# Patient Record
Sex: Female | Born: 1975
Health system: Southern US, Community
[De-identification: ages and names within clinical notes are randomized; demographics above are authoritative.]

## PROBLEM LIST (undated history)

## (undated) DIAGNOSIS — I10 Essential (primary) hypertension: Secondary | ICD-10-CM

## (undated) DIAGNOSIS — R519 Headache, unspecified: Secondary | ICD-10-CM

## (undated) DIAGNOSIS — T4145XA Adverse effect of unspecified anesthetic, initial encounter: Secondary | ICD-10-CM

## (undated) DIAGNOSIS — IMO0002 Reserved for concepts with insufficient information to code with codable children: Secondary | ICD-10-CM

## (undated) DIAGNOSIS — R51 Headache: Secondary | ICD-10-CM

## (undated) DIAGNOSIS — N926 Irregular menstruation, unspecified: Secondary | ICD-10-CM

## (undated) DIAGNOSIS — R87619 Unspecified abnormal cytological findings in specimens from cervix uteri: Secondary | ICD-10-CM

## (undated) DIAGNOSIS — D649 Anemia, unspecified: Secondary | ICD-10-CM

## (undated) DIAGNOSIS — T8859XA Other complications of anesthesia, initial encounter: Secondary | ICD-10-CM

## (undated) HISTORY — DX: Anemia, unspecified: D64.9

## (undated) HISTORY — DX: Irregular menstruation, unspecified: N92.6

## (undated) HISTORY — DX: Unspecified abnormal cytological findings in specimens from cervix uteri: R87.619

## (undated) HISTORY — DX: Adverse effect of unspecified anesthetic, initial encounter: T41.45XA

## (undated) HISTORY — DX: Other complications of anesthesia, initial encounter: T88.59XA

## (undated) HISTORY — PX: TUBAL LIGATION: SHX77

## (undated) HISTORY — DX: Reserved for concepts with insufficient information to code with codable children: IMO0002

## (undated) HISTORY — DX: Headache, unspecified: R51.9

## (undated) HISTORY — PX: OTHER SURGICAL HISTORY: SHX169

## (undated) HISTORY — DX: Headache: R51

---

## 2001-05-03 ENCOUNTER — Other Ambulatory Visit: Admission: RE | Admit: 2001-05-03 | Discharge: 2001-05-03 | Payer: Self-pay | Admitting: Obstetrics and Gynecology

## 2001-07-23 ENCOUNTER — Inpatient Hospital Stay (HOSPITAL_COMMUNITY): Admission: AD | Admit: 2001-07-23 | Discharge: 2001-07-23 | Payer: Self-pay | Admitting: Obstetrics and Gynecology

## 2001-08-09 ENCOUNTER — Other Ambulatory Visit: Admission: RE | Admit: 2001-08-09 | Discharge: 2001-08-09 | Payer: Self-pay | Admitting: Obstetrics and Gynecology

## 2001-09-29 ENCOUNTER — Inpatient Hospital Stay (HOSPITAL_COMMUNITY): Admission: AD | Admit: 2001-09-29 | Discharge: 2001-10-02 | Payer: Self-pay | Admitting: Obstetrics and Gynecology

## 2001-09-29 ENCOUNTER — Encounter (INDEPENDENT_AMBULATORY_CARE_PROVIDER_SITE_OTHER): Payer: Self-pay | Admitting: Specialist

## 2002-01-07 ENCOUNTER — Other Ambulatory Visit: Admission: RE | Admit: 2002-01-07 | Discharge: 2002-01-07 | Payer: Self-pay | Admitting: Obstetrics and Gynecology

## 2002-03-12 ENCOUNTER — Other Ambulatory Visit: Admission: RE | Admit: 2002-03-12 | Discharge: 2002-03-12 | Payer: Self-pay | Admitting: Obstetrics and Gynecology

## 2002-03-27 ENCOUNTER — Emergency Department (HOSPITAL_COMMUNITY): Admission: EM | Admit: 2002-03-27 | Discharge: 2002-03-27 | Payer: Self-pay | Admitting: Emergency Medicine

## 2003-02-25 ENCOUNTER — Other Ambulatory Visit: Admission: RE | Admit: 2003-02-25 | Discharge: 2003-02-25 | Payer: Self-pay | Admitting: Obstetrics and Gynecology

## 2003-12-31 ENCOUNTER — Encounter: Admission: RE | Admit: 2003-12-31 | Discharge: 2003-12-31 | Payer: Self-pay | Admitting: Obstetrics and Gynecology

## 2004-09-15 ENCOUNTER — Emergency Department (HOSPITAL_COMMUNITY): Admission: EM | Admit: 2004-09-15 | Discharge: 2004-09-15 | Payer: Self-pay | Admitting: Emergency Medicine

## 2010-04-19 ENCOUNTER — Emergency Department (HOSPITAL_COMMUNITY)
Admission: EM | Admit: 2010-04-19 | Discharge: 2010-04-19 | Payer: Self-pay | Source: Home / Self Care | Admitting: Emergency Medicine

## 2010-12-17 LAB — CBC
HCT: 35.2 % — ABNORMAL LOW (ref 36.0–46.0)
Hemoglobin: 11.8 g/dL — ABNORMAL LOW (ref 12.0–15.0)
MCH: 29.1 pg (ref 26.0–34.0)
MCHC: 33.6 g/dL (ref 30.0–36.0)
MCV: 86.6 fL (ref 78.0–100.0)
Platelets: 224 10*3/uL (ref 150–400)
RBC: 4.06 MIL/uL (ref 3.87–5.11)
RDW: 14.5 % (ref 11.5–15.5)
WBC: 7.3 10*3/uL (ref 4.0–10.5)

## 2010-12-17 LAB — URINE MICROSCOPIC-ADD ON

## 2010-12-17 LAB — DIFFERENTIAL
Basophils Absolute: 0 10*3/uL (ref 0.0–0.1)
Basophils Relative: 0 % (ref 0–1)
Eosinophils Absolute: 0.1 10*3/uL (ref 0.0–0.7)
Eosinophils Relative: 1 % (ref 0–5)
Lymphocytes Relative: 18 % (ref 12–46)
Lymphs Abs: 1.3 10*3/uL (ref 0.7–4.0)
Monocytes Absolute: 0.4 10*3/uL (ref 0.1–1.0)
Monocytes Relative: 6 % (ref 3–12)
Neutro Abs: 5.4 10*3/uL (ref 1.7–7.7)
Neutrophils Relative %: 74 % (ref 43–77)

## 2010-12-17 LAB — URINALYSIS, ROUTINE W REFLEX MICROSCOPIC
Bilirubin Urine: NEGATIVE
Glucose, UA: NEGATIVE mg/dL
Ketones, ur: NEGATIVE mg/dL
Leukocytes, UA: NEGATIVE
Nitrite: NEGATIVE
Protein, ur: NEGATIVE mg/dL
Specific Gravity, Urine: 1.034 — ABNORMAL HIGH (ref 1.005–1.030)
Urobilinogen, UA: 1 mg/dL (ref 0.0–1.0)
pH: 5.5 (ref 5.0–8.0)

## 2010-12-17 LAB — COMPREHENSIVE METABOLIC PANEL
ALT: 44 U/L — ABNORMAL HIGH (ref 0–35)
AST: 52 U/L — ABNORMAL HIGH (ref 0–37)
Albumin: 3.8 g/dL (ref 3.5–5.2)
Alkaline Phosphatase: 60 U/L (ref 39–117)
BUN: 10 mg/dL (ref 6–23)
CO2: 26 mEq/L (ref 19–32)
Calcium: 9.1 mg/dL (ref 8.4–10.5)
Chloride: 104 mEq/L (ref 96–112)
Creatinine, Ser: 0.81 mg/dL (ref 0.4–1.2)
GFR calc Af Amer: >60 mL/min (ref >60)
GFR calc non Af Amer: >60 mL/min (ref >60)
Glucose, Bld: 159 mg/dL — ABNORMAL HIGH (ref 70–99)
Potassium: 3.5 mEq/L (ref 3.5–5.1)
Sodium: 137 mEq/L (ref 135–145)
Total Bilirubin: 0.4 mg/dL (ref 0.3–1.2)
Total Protein: 7.6 g/dL (ref 6.0–8.3)

## 2010-12-17 LAB — POCT CARDIAC MARKERS
CKMB, poc: <1 ng/mL — ABNORMAL LOW (ref 1.0–8.0)
Myoglobin, poc: 61.5 ng/mL (ref 12–200)
Troponin i, poc: <0.05 ng/mL (ref 0.00–0.09)

## 2010-12-17 LAB — D-DIMER, QUANTITATIVE: D-Dimer, Quant: 0.26 ug/mL-FEU (ref 0.00–0.48)

## 2010-12-17 LAB — URINE CULTURE: Colony Count: 50000

## 2010-12-17 LAB — POCT PREGNANCY, URINE: Preg Test, Ur: NEGATIVE

## 2010-12-17 LAB — LIPASE, BLOOD: Lipase: 30 U/L (ref 11–59)

## 2011-02-17 NOTE — Discharge Summary (Signed)
[facility]  Patient:    Diana Cherry, Diana Cherry Visit Number: [id number] MRN: [id number]          Service Type: OBS Location: 910A 9148 01 Attending Physician:  Leonard Schwartz Dictated by:   Mack Guise, C.N.M. Admit Date:  09/29/2001 Discharge Date: 10/02/2001                             Discharge Summary  ADMISSION DIAGNOSES: 1. Intrauterine pregnancy at term. 2. Active labor. 3. Two prior cesarean sections. 4. Desires repeat cesarean section. 5. Desires sterilization.  PROCEDURE:  Repeat low transverse cesarean section and bilateral tubal ligation.  DISCHARGE DIAGNOSES: 1. Intrauterine pregnancy at term. 2. Active labor. 3. Two prior cesarean sections. 4. Desires repeat cesarean section. 5. Desires sterilization.  HISTORY OF PRESENT ILLNESS:  Ms. Diana Cherry is a 35 year old, gravida 3, para 2-0-0-2, who presented in early labor at 39-2/7 weeks and requests repeat cesarean delivery and sterilization.  This was accomplished by Dr. Marline Backbone on 09/29/01, with the birth of a 6 pound 14 ounce female infant, [place], with Apgar scores of 9 at one minute and 9 at five minutes.  The patient has done well in the immediate postoperative period.  Her hemoglobin on the first postoperative day was 9.7.  She has remained afebrile and stable, and on this her third postoperative day she is deemed to be in satisfactory condition for discharge.  DISCHARGE INSTRUCTIONS:  Per [facility] handout.  DISCHARGE MEDICATIONS: 1. Motrin 600 mg p.o. q.6h. p.r.n. pain. 2. Tylox one or two p.o. q.3-4h. pain. 3. Prenatal vitamins.  FOLLOWUP:  Occur at [facility] in six weeks. Dictated by:   Mack Guise, C.N.M. Attending Physician:  Leonard Schwartz DD:  10/02/01 TD:  10/02/01 Job: [id number]

## 2011-02-17 NOTE — H&P (Signed)
[facility]  Patient:    Diana Cherry, Diana Cherry Visit Number: [id number] MRN: [id number]          Service Type: OBS Location: 910B 9162 01 Attending Physician:  Leonard Schwartz Dictated by:   Wynelle Bourgeois, CNM Admit Date:  09/29/2001                           History and Physical  HISTORY OF PRESENT ILLNESS:   This is a 35 year old gravida 3, para 2-0-0-2, at 39-2/7 weeks who presents with contractions every five minutes for one hour.  She denies leaking or bleeding and reports positive fetal movement. Her pregnancy has been followed by the M.D. service and remarkable for: 1. Previous C-section x 2, desires trial of labor. 2. Positive group B strep. 3. Rh negative. 4. Late to care. 5. Abnormal Pap. 6. Desires elective tubal ligation.  OBSTETRICAL HISTORY:          Primary C-section in 1996, at [redacted] weeks gestation weighing 6 pounds, 8 ounces, of a female infant.  She had a primary C-section in 1999, of a female infant at [redacted] weeks gestation weighing 5 pounds 8 ounces. On her second pregnancy she got to 10 cm and pushed for two hours, but then the babys heartbeat was dropping, and she had a repeat C-section.  PAST MEDICAL HISTORY:         1. 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                               2. History of Rh negative, with RhoGAM given at                                 28 weeks.                               3. Childhood varicella.  FAMILY HISTORY:               Father and maternal aunt with hypertension. Mother with anemia.  Maternal first cousin with asthma.  Maternal aunt with diabetes.  Mother with lupus.  Maternal aunt with migraines.  PAST SURGICAL HISTORY:        C-section x 2.  GENETIC HISTORY:              Unremarkable.  SOCIAL HISTORY:               The patient is single but involved with Royston Sinner, who is involved and supportive.  She is  of the [employer].  She denies any alcohol, tobacco, or drug use.  PHYSICAL EXAMINATION:  VITAL SIGNS:                  Stable.  Afebrile.  HEENT:                        Within normal limits.  NECK:  Thyroid normal, not enlarged.  BREASTS:                      Soft and nontender.  No masses.  CHEST:                        Clear to auscultation bilaterally.  HEART:                        Regular rate and rhythm.  ABDOMEN:                      Gravid, at 40 cm.  EFM shows reactive fetal heart rate tracing without decelerations.  Uterine contractions initially were every 5-6 minutes and are now every 3-4 minutes.  PELVIC:                       Cervical exam was initially 2 cm, 80% effaced, -1 station, with a vertex presentation.  After 1-1/2 hours, progressed to 3 cm, 85% effaced, and -1 station.  EXTREMITIES:                  Within normal limits.  ASSESSMENT:                   1. Intrauterine pregnancy at 39-2/7 weeks.                               2. Previous cesarean section, desires trial of                                  labor.                               3. Early labor.                               4. Group B strep positive.  PLAN:                         1. Admit to birthing suite.  Dr. Stefano Gaul                                  notified.                               2. Routine M.D. orders.                               3. Penicillin prophylaxis.                               4. Further orders per Dr. Stefano Gaul. Dictated by:   Wynelle Bourgeois, CNM Attending Physician:  Leonard Schwartz DD:  09/29/01 TD:  09/29/01 Job: [id number]

## 2011-02-17 NOTE — Op Note (Signed)
[facility]  Patient:    Diana, Cherry Visit Number: [id number] MRN: [id number]          Service Type: OBS Location: 910A 9148 01 Attending Physician:  Leonard Schwartz Dictated by:   Janine Limbo, M.D. Proc. Date: 09/29/01 Admit Date:  09/29/2001                             Operative Report  PREOPERATIVE DIAGNOSES:       1. Term intrauterine pregnancy.                               2. Active labor.                               3. Two prior cesarean sections.                               4. Desires repeat cesarean section.                               5. Desires sterilization.  POSTOPERATIVE DIAGNOSES:      1. Term intrauterine pregnancy.                               2. Active labor.                               3. Two prior cesarean sections.                               4. Desires repeat cesarean section.                               5. Desires sterilization.                               6. Pelvic adhesions.  PROCEDURES:                   1. Repeat low transverse cesarean section.                               2. Bilateral tubal ligation (modified [place]).  SURGEON:                      Janine Limbo, M.D.  FIRST ASSISTANT:              Wynelle Bourgeois, C.N.M.  ANESTHETIC:                   Spinal.  INDICATIONS:                  The patient is a 35 year old female, gravida 3, para 2-0-0-2 who presents at term with active labor.  She has had two prior cesarean deliveries.  She desires repeat cesarean section.  We discussed the benefits as well  as the risks associated with a vaginal birth after cesarean section.  After the patient and her husband discussed these risks and benefits, the patient decided to proceed with repeat cesarean section and tubal ligation.   We reviewed the risks, which include (but are not limited to) anesthetic complications, bleeding, infections, possible damage to surrounding organs  and possible tubal failure (10-17 per 1000).  FINDINGS:                     A 6 lb 14 oz female infant ([place]) was delivered from a cephalic presentation.  The infant was in the occiput posterior position.  Apgars were 9 at one minute and 9 at five minutes.  There were two nuchal cords present.  The fallopian tubes and ovaries appeared normal.  The uterus was normal except that there were dense adhesions between the anterior uterine surface and the anterior peritoneum.  DESCRIPTION OF PROCEDURE:     The patient was taken to the operating room, where a spinal anesthetic was given.  The patients abdomen, perineum and vagina were prepped with multiple layers of Betadine.  A Foley catheter was placed in the bladder.  The patient was sterilely draped.  A low transverse incision was made in the abdomen through the previous incision and carried sharply through the subcutaneous tissue, the fascia and the anterior peritoneum.  There was scarring from the patients prior two cesarean deliveries  The anterior peritoneum was adhered to the lower uterine segment. The adhesions were lysed using a combination of sharp and blunt dissection. An incision was made in the lower uterine segment and extended transversely. The amniotic fluid was noted to be clear.  The fetal head was then delivered without difficulty.  The mouth and nose were suctioned.  Two nuchal cords were noted.  The infant was in the occiput posterior position.  The infant was fully delivered and handed to the awaiting pediatric team.  Routine cord blood studies were obtained.  The placenta was removed.  The uterine cavity was cleaned of amniotic fluid and clotted blood.  The uterine incision was closed using a running suture of 2-0 Vicryl followed by interrupted sutures of 2-0 Vicryl for hemostasis.  Hemostasis was noted to be adequate.  The left fallopian tube was identified and followed to its fimbriated end.  A knuckle of tube was made  on the left using two free ties of 0 plain cat gut.  The knuckle of tube thus made was sharply excised.  Hemostasis was adequate.  An identical procedure was carried out on the opposite site.  Again, hemostasis was adequate.  The pelvis was vigorously irrigated.  The anterior peritoneum and the abdominal musculature were reapproximated in the midline using 2-0 Vicryl.  The fascia and the subcutaneous tissue were irrigated.  The fascia was closed using a running suture of 0 Vicryl followed by three interrupted sutures of 0 Vicryl.  Hemostasis was felt to be adequate in the subcutaneous layer.  The skin was reapproximated using a subcuticular suture of 4-0 Monocryl.  Sponge, needle and instrument counts were correct on two occasions. Estimated blood loss was 700 cc.  The patient tolerated the procedure well. She was taken to the recovery room in stable condition.  The patient was noted to drain clear yellow urine at the end of her procedure.  The infant was taken to the full-term nursery in stable condition. Dictated by:   Janine Limbo, M.D. Attending Physician:  Leonard Schwartz DD:  09/29/01 TD:  09/29/01 Job: [id number]

## 2012-02-04 ENCOUNTER — Emergency Department (HOSPITAL_COMMUNITY)
Admission: EM | Admit: 2012-02-04 | Discharge: 2012-02-04 | Disposition: A | Discharge status: Home / Self Care | Payer: PRIVATE HEALTH INSURANCE | Attending: Emergency Medicine | Admitting: Emergency Medicine

## 2012-02-04 ENCOUNTER — Emergency Department (HOSPITAL_COMMUNITY): Payer: PRIVATE HEALTH INSURANCE

## 2012-02-04 ENCOUNTER — Encounter (HOSPITAL_COMMUNITY): Payer: Self-pay | Admitting: Emergency Medicine

## 2012-02-04 DIAGNOSIS — Y9239 Other specified sports and athletic area as the place of occurrence of the external cause: Secondary | ICD-10-CM | POA: Insufficient documentation

## 2012-02-04 DIAGNOSIS — Y92838 Other recreation area as the place of occurrence of the external cause: Secondary | ICD-10-CM | POA: Insufficient documentation

## 2012-02-04 DIAGNOSIS — M79609 Pain in unspecified limb: Secondary | ICD-10-CM | POA: Insufficient documentation

## 2012-02-04 DIAGNOSIS — S6980XA Other specified injuries of unspecified wrist, hand and finger(s), initial encounter: Secondary | ICD-10-CM | POA: Insufficient documentation

## 2012-02-04 DIAGNOSIS — Y9367 Activity, basketball: Secondary | ICD-10-CM | POA: Insufficient documentation

## 2012-02-04 DIAGNOSIS — S6990XA Unspecified injury of unspecified wrist, hand and finger(s), initial encounter: Secondary | ICD-10-CM | POA: Insufficient documentation

## 2012-02-04 DIAGNOSIS — M6789 Other specified disorders of synovium and tendon, multiple sites: Secondary | ICD-10-CM

## 2012-02-04 DIAGNOSIS — W219XXA Striking against or struck by unspecified sports equipment, initial encounter: Secondary | ICD-10-CM | POA: Insufficient documentation

## 2012-02-04 NOTE — ED Notes (Signed)
5th finger on r/hand is bent, x 3 weeks due to sports related accident. Tx with ice

## 2012-02-04 NOTE — ED Provider Notes (Signed)
History     CSN: [id number]  Arrival date & time 02/04/12  1442   First MD Initiated Contact with Patient 02/04/12 1501      Chief Complaint  Patient presents with  . Finger Injury    sport related injury 3 weeks ago - r/fifth finger    (Consider location/radiation/quality/duration/timing/severity/associated sxs/prior treatment) HPI The patient presents with the deformity to her R little finger from an injury three weeks ago. She was struck on the end of this finger with a basketball. The patient has not gotten any relief with home splinting. The patient has no numbness in the finger. History reviewed. No pertinent past medical history.  Past Surgical History  Procedure Date  . Cesarean section     Family History  Problem Relation Age of Onset  . Hypertension Mother     History  Substance Use Topics  . Smoking status: Never Smoker   . Smokeless tobacco: Not on file  . Alcohol Use: Yes    OB History    Grav Para Term Preterm Abortions TAB SAB Ect Mult Living                  Review of Systems All other systems negative except as documented in the HPI. All pertinent positives and negatives as reviewed in the HPI.  Allergies  Review of patient's allergies indicates no known allergies.  Home Medications  No current outpatient prescriptions on file.  BP 144/87  Pulse 68  Temp(Src) 98.3 F (36.8 C) (Oral)  Resp 18  SpO2 98%  LMP 01/16/2012  Physical Exam Physical Examination: General appearance - alert, well appearing, and in no distress Chest - clear to auscultation, no wheezes, rales or rhonchi, symmetric air entry Heart - normal rate, regular rhythm, normal S1, S2, no murmurs, rubs, clicks or gallops Musculoskeletal - The patient has a flexion deformity of the R little finger. The patient has no ability to extend the finger.  ED Course  Procedures (including critical care time)  Labs Reviewed - No data to display Dg Finger Little Right  02/04/2012   *RADIOLOGY REPORT*  Clinical Data: Pain after blunt injury, dislocation  RIGHT LITTLE FINGER 2+V  Comparison: None.  Findings: There is no evidence of fracture.  There is mild medial and posterior angulation of the fifth DIP joint.  IMPRESSION: Medial and posterior angulation of the fifth DIP joint.  Original Report Authenticated By: Brandon Melnick, M.D.    I spoke with Dr. Lajoyce Corners about the patient and he will follow up with her in his office. The patient is splinted here.    MDM          Carlyle Dolly, PA-C 02/04/12 [phone]

## 2012-02-06 NOTE — ED Provider Notes (Signed)
Medical screening examination/treatment/procedure(s) were performed by non-physician practitioner and as supervising physician I was immediately available for consultation/collaboration.  Toy Baker, MD 02/06/12 [phone]

## 2012-05-30 ENCOUNTER — Encounter: Payer: BC Managed Care – PPO | Admitting: Obstetrics and Gynecology

## 2012-07-03 ENCOUNTER — Encounter: Payer: BC Managed Care – PPO | Admitting: Obstetrics and Gynecology

## 2012-07-05 ENCOUNTER — Encounter: Payer: Self-pay | Admitting: Obstetrics and Gynecology

## 2012-07-05 ENCOUNTER — Ambulatory Visit (INDEPENDENT_AMBULATORY_CARE_PROVIDER_SITE_OTHER): Payer: BC Managed Care – PPO | Admitting: Obstetrics and Gynecology

## 2012-07-05 VITALS — BP 126/96 | Ht 64.0 in | Wt 206.0 lb

## 2012-07-05 DIAGNOSIS — N926 Irregular menstruation, unspecified: Secondary | ICD-10-CM

## 2012-07-05 DIAGNOSIS — G5600 Carpal tunnel syndrome, unspecified upper limb: Secondary | ICD-10-CM

## 2012-07-05 DIAGNOSIS — E669 Obesity, unspecified: Secondary | ICD-10-CM

## 2012-07-05 DIAGNOSIS — Z124 Encounter for screening for malignant neoplasm of cervix: Secondary | ICD-10-CM

## 2012-07-05 LAB — CBC
HCT: 35.1 % — ABNORMAL LOW (ref 36.0–46.0)
Hemoglobin: 11.6 g/dL — ABNORMAL LOW (ref 12.0–15.0)
MCH: 27.7 pg (ref 26.0–34.0)
MCHC: 33 g/dL (ref 30.0–36.0)
MCV: 83.8 fL (ref 78.0–100.0)
Platelets: 238 10*3/uL (ref 150–400)
RBC: 4.19 MIL/uL (ref 3.87–5.11)
RDW: 15.7 % — ABNORMAL HIGH (ref 11.5–15.5)
WBC: 6.4 10*3/uL (ref 4.0–10.5)

## 2012-07-05 LAB — PROLACTIN: Prolactin: 4.2 ng/mL

## 2012-07-05 LAB — TSH: TSH: 1.321 u[IU]/mL (ref 0.350–4.500)

## 2012-07-05 LAB — HEMOGLOBIN A1C
Hgb A1c MFr Bld: 5.9 % — ABNORMAL HIGH (ref <5.7)
Mean Plasma Glucose: 123 mg/dL — ABNORMAL HIGH (ref <117)

## 2012-07-05 NOTE — Patient Instructions (Signed)

## 2012-07-05 NOTE — Progress Notes (Signed)
Last Pap: 56yrs ago WNL: Yes per pt Regular Periods:no Contraception: condoms  Monthly Breast exam:no Tetanus<34yrs:no Nl.Bladder Function:yes Daily BMs:yes Healthy Diet:yes Calcium:no Mammogram:yes Date of Mammogram: 51yrs ago-wnl per pt lump of right breast Exercise:yes Have often Exercise: daily Seatbelt: yes Abuse at home: no Stressful work:yes Sigmoid-colonoscopy: n/a Bone Density: No PCP: none Change in PMH: bilateral carpal tunnel Change in [facility]: see family history documentation BMI=36 Pt with several concerns.  She followed her menses.  They are q 24-25 days.  She changes a pad q 2-3 hrs. Passe clots. She feels like they come to often.  No bleeding disorders.  No dizziness or chest pain. They have been heavy for five years.  She also c/o bilateral hand numbness from carpal tunnel.  It makes it difficult to perform tasks.  She also has some swelling at the heels of her feet.  Pt also c/o of rectal pain.  No bleeding.  She has a BM QD, brown in color BP 126/96  Ht 5\' 4"  (1.626 m)  Wt 206 lb (93.441 kg)  BMI 35.36 kg/m2  LMP 06/17/2012 Pt with complaints: yes see above Physical Examination: General appearance - alert, well appearing, and in no distress Mental status - normal mood, behavior, speech, dress, motor activity, and thought processes Neck - supple, no significant adenopathy,  thyroid exam: thyroid is normal in size without nodules or tenderness Chest - clear to auscultation, no wheezes, rales or rhonchi, symmetric air entry Heart - normal rate and regular rhythm Abdomen - soft, nontender, nondistended, no masses or organomegaly Breasts - breasts appear normal, no suspicious masses, no skin or nipple changes or axillary nodes Pelvic - normal external genitalia, vulva, vagina, cervix, uterus and adnexa Rectal - normal rectal, no masses Back exam - full range of motion, no tenderness, palpable spasm or pain on motion Neurological - alert, oriented, normal speech, no  focal findings or movement disorder noted Musculoskeletal - no joint tenderness, deformity or swelling Extremities - no edema, redness or tenderness in the calves or thighs Skin - normal coloration and turgor, no rashes, no suspicious skin lesions noted Routine exam Pap sent yes Mammogram due no BTL used for contraception RT for shg/embx Refer to sports med for carpal tunnel Rectal exam normal. Pt declined I referral

## 2012-07-08 LAB — PAP IG W/ RFLX HPV ASCU

## 2012-07-09 ENCOUNTER — Telehealth: Payer: Self-pay

## 2012-07-09 NOTE — Telephone Encounter (Signed)
Tc to pt rgd referral for carpel tunnel pt has appt with Dr.Mckinnley 07/11/12 @ 9:00 lm on vm tcb rgd referral

## 2012-07-09 NOTE — Telephone Encounter (Signed)
Pt need to f/u with pcp rgd HGB A1C

## 2012-07-10 NOTE — Telephone Encounter (Signed)
Lm on vm tcb rgd labs and referral 

## 2012-07-11 NOTE — Telephone Encounter (Signed)
Lm on vm tcb rgd labs and referral

## 2012-07-11 NOTE — Telephone Encounter (Signed)
Spoke with pt rgd labs and referral informed hgb a1c elevated f/u with pcp informed pt of referral to Dr.Mickennly number given to r/s at her convince pt voice understanding

## 2012-07-22 ENCOUNTER — Ambulatory Visit (INDEPENDENT_AMBULATORY_CARE_PROVIDER_SITE_OTHER): Payer: BC Managed Care – PPO | Admitting: Obstetrics and Gynecology

## 2012-07-22 ENCOUNTER — Ambulatory Visit (INDEPENDENT_AMBULATORY_CARE_PROVIDER_SITE_OTHER): Payer: BC Managed Care – PPO

## 2012-07-22 ENCOUNTER — Encounter: Payer: Self-pay | Admitting: Obstetrics and Gynecology

## 2012-07-22 ENCOUNTER — Other Ambulatory Visit: Payer: Self-pay | Admitting: Obstetrics and Gynecology

## 2012-07-22 VITALS — BP 122/74 | HR 76 | Wt 209.0 lb

## 2012-07-22 DIAGNOSIS — N9489 Other specified conditions associated with female genital organs and menstrual cycle: Secondary | ICD-10-CM

## 2012-07-22 DIAGNOSIS — N926 Irregular menstruation, unspecified: Secondary | ICD-10-CM

## 2012-07-22 DIAGNOSIS — N920 Excessive and frequent menstruation with regular cycle: Secondary | ICD-10-CM

## 2012-07-22 NOTE — Progress Notes (Signed)
36 YO with history of menorrhagia presents for [facility] per Dr. Normand Sloop.   O: UterusL 9.60 x 6.11 x 5.15cm, endomterium 0.755 cm, a solid focus seen in the lower uterine segment left wall consistent with a polyp = 0.93 x 0.61 x 0.48       cm,  ovaries appear normal  A; Menorrhagia     Endometrial mass consistent with a polyp  P: Reviewed endometrial polyp and management      Reviewed management options for menorrhagia: hormonal, Lysteda, D & C and observation     To advise Dr. Normand Sloop that patient wants to pursue D & C (in office if possible)     RTO-TBA  Siah Kannan, PA-C

## 2012-07-22 NOTE — Patient Instructions (Signed)
endometrial polyps

## 2012-07-24 ENCOUNTER — Other Ambulatory Visit: Payer: Self-pay | Admitting: Obstetrics and Gynecology

## 2012-07-24 DIAGNOSIS — N926 Irregular menstruation, unspecified: Secondary | ICD-10-CM

## 2012-08-06 ENCOUNTER — Telehealth: Payer: Self-pay | Admitting: Obstetrics and Gynecology

## 2012-08-06 NOTE — Telephone Encounter (Signed)
D&C Hysteroscopy; Polypectomy with TruClear scheduled for 08/27/12 @ 9:00 with ND.  [employer] effective 05/02/12.  Plan pays 80/20 after a $300 deductible. Pre-op due $78.66. -Adrianne Pridgen

## 2012-08-13 ENCOUNTER — Other Ambulatory Visit: Payer: Self-pay | Admitting: Obstetrics and Gynecology

## 2012-08-15 ENCOUNTER — Encounter (HOSPITAL_COMMUNITY): Payer: Self-pay | Admitting: *Deleted

## 2012-08-16 ENCOUNTER — Encounter: Payer: Self-pay | Admitting: Obstetrics and Gynecology

## 2012-08-16 ENCOUNTER — Ambulatory Visit (INDEPENDENT_AMBULATORY_CARE_PROVIDER_SITE_OTHER): Payer: BC Managed Care – PPO | Admitting: Obstetrics and Gynecology

## 2012-08-16 VITALS — BP 140/88 | HR 84 | Temp 98.2°F | Resp 14 | Ht 64.0 in | Wt 211.0 lb

## 2012-08-16 DIAGNOSIS — N840 Polyp of corpus uteri: Secondary | ICD-10-CM

## 2012-08-16 DIAGNOSIS — Z01818 Encounter for other preprocedural examination: Secondary | ICD-10-CM

## 2012-08-16 DIAGNOSIS — N920 Excessive and frequent menstruation with regular cycle: Secondary | ICD-10-CM

## 2012-08-19 ENCOUNTER — Encounter (HOSPITAL_COMMUNITY): Payer: Self-pay | Admitting: Pharmacist

## 2012-08-20 NOTE — H&P (Signed)
  Diana Cherry is a 36 y.o. female [phone] who presents for hysteroscopy, dilatation, curettage and polypectomy because of menorrhagia and endometrial polyp. Over the past year, patient reports longer and heavier periods with them now lasting 7 days and requiring a super pad change every 30 minutes.  She denies cramping, urinary tract symptoms, or changes in bowel movements,  but states she feels very sore during her period and has started to bleed every two weeks for seven days.  A  07/22/12,  sonohysterogram showed  uterus 9.60 x 6.11 x 5.15 cm, endometrium 0.755 cm with a solid focus in the lower uterine segment consistent with a polyp-0.93 x 0.61 x 0.48 cm; both ovaries appeared normal. A TSH and Prolactin were normal as was a CBC except H/H=11.6/35.1.  After review of sonogram findings and consideration of medical and surgical interventions available, the patient opted to proceed with surgical management.   Past Medical History  OB History: [id number] C-section x 3;  1996, 1999 and 2002  GYN History: menarche: 10    LMP: 08/09/12; Contracepton: Tubal Sterilization  The patient denies history of sexually transmitted disease.  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 Gabapentin (NEURONTIN PO) Take by mouth.      . OxyCODONE HCl (OXYCONTIN PO) Take by mouth.        No Known Allergies Denies sensitivity to  peanuts, shellfish, soy, latex or adhesives.   ROS: Denies headache, vision changes, nasal congestion, dysphagia, tinnitus, dizziness, hoarseness, cough,  chest pain, shortness of breath, nausea, vomiting, diarrhea,constipation,  urinary frequency, urgency  dysuria, hematuria, vaginitis symptoms, pelvic pain, swelling of joints,easy bruising,  myalgias, arthralgias, skin rashes, unexplained weight loss and except as is mentioned in the history of present illness, patient's review of systems is otherwise negative.  Physical Exam    BP 140/88  Pulse 84  Temp 98.2 F (36.8 C) (Oral)  Resp 14  Ht 5\' 4"  (1.626 m)  Wt 211 lb (95.709 kg)  BMI 36.22 kg/m2  LMP 08/09/2012  Neck: supple without masses or thyromegaly Lungs: clear to auscultation Heart: regular rate and rhythm Abdomen: soft, non-tender and no organomegaly Pelvic:EGBUS- wnl; vagina-normal rugae; uterus-normal size, cervix without lesions or motion tenderness; adnexae-no tenderness or masses Extremities:  no clubbing, cyanosis or edema   Assesment:  Menorrhagia                        Endometrial Mass   Disposition:  A discussion was held with patient regarding the indication for her procedure(s) along with the risks, which include but are not limited to: reaction to anesthesia, damage to adjacent organs, infection, excessive bleeding and endometrial scarring.  Patient has consented to proceed with a hysteroscopy, dilatation, curettage and polypectomy using Tru-Clear at [facility] [place] on August 27, 2012 at 9 a.m.   CSN# [id number]   [name] J. Lowell Guitar, PA-C  for Dr. Pierre Bali. Dillard

## 2012-08-27 ENCOUNTER — Encounter (HOSPITAL_COMMUNITY)
Admission: RE | Disposition: A | Discharge status: Home / Self Care | Payer: Self-pay | Source: Ambulatory Visit | Attending: Obstetrics and Gynecology

## 2012-08-27 ENCOUNTER — Ambulatory Visit (HOSPITAL_COMMUNITY)
Admission: RE | Admit: 2012-08-27 | Discharge: 2012-08-27 | Disposition: A | Discharge status: Home / Self Care | Payer: Medicaid Other | Source: Ambulatory Visit | Attending: Obstetrics and Gynecology | Admitting: Obstetrics and Gynecology

## 2012-08-27 ENCOUNTER — Encounter (HOSPITAL_COMMUNITY): Payer: Self-pay | Admitting: Anesthesiology

## 2012-08-27 ENCOUNTER — Ambulatory Visit (HOSPITAL_COMMUNITY): Payer: Medicaid Other | Admitting: Anesthesiology

## 2012-08-27 DIAGNOSIS — N840 Polyp of corpus uteri: Secondary | ICD-10-CM | POA: Insufficient documentation

## 2012-08-27 DIAGNOSIS — N920 Excessive and frequent menstruation with regular cycle: Secondary | ICD-10-CM | POA: Insufficient documentation

## 2012-08-27 HISTORY — PX: DILATATION & CURRETTAGE/HYSTEROSCOPY WITH RESECTOCOPE: SHX5572

## 2012-08-27 LAB — CBC
HCT: 35.8 % — ABNORMAL LOW (ref 36.0–46.0)
Hemoglobin: 11.9 g/dL — ABNORMAL LOW (ref 12.0–15.0)
MCH: 28.3 pg (ref 26.0–34.0)
MCHC: 33.2 g/dL (ref 30.0–36.0)
MCV: 85 fL (ref 78.0–100.0)
Platelets: 203 10*3/uL (ref 150–400)
RBC: 4.21 MIL/uL (ref 3.87–5.11)
RDW: 14.6 % (ref 11.5–15.5)
WBC: 4.7 10*3/uL (ref 4.0–10.5)

## 2012-08-27 LAB — PREGNANCY, URINE: Preg Test, Ur: NEGATIVE

## 2012-08-27 SURGERY — DILATATION & CURETTAGE/HYSTEROSCOPY WITH RESECTOCOPE
Anesthesia: General | Site: Uterus | Wound class: Clean Contaminated

## 2012-08-27 MED ORDER — IBUPROFEN 600 MG PO TABS: 600.0000 mg | ORAL_TABLET | Freq: Four times a day (QID) | ORAL | Status: DC | PRN

## 2012-08-27 MED ORDER — FENTANYL CITRATE 0.05 MG/ML IJ SOLN
INTRAMUSCULAR | Status: AC
  Filled 2012-08-27: qty 2

## 2012-08-27 MED ORDER — LIDOCAINE HCL (CARDIAC) 20 MG/ML IV SOLN
INTRAVENOUS | Status: AC
  Filled 2012-08-27: qty 5

## 2012-08-27 MED ORDER — MIDAZOLAM HCL 2 MG/2ML IJ SOLN
INTRAMUSCULAR | Status: AC
  Filled 2012-08-27: qty 2

## 2012-08-27 MED ORDER — ONDANSETRON HCL 4 MG/2ML IJ SOLN
INTRAMUSCULAR | Status: AC
  Filled 2012-08-27: qty 2

## 2012-08-27 MED ORDER — PROPOFOL 10 MG/ML IV EMUL: INTRAVENOUS | Status: DC | PRN

## 2012-08-27 MED ORDER — LIDOCAINE HCL 2 % IJ SOLN
INTRAMUSCULAR | Status: AC
  Filled 2012-08-27: qty 20

## 2012-08-27 MED ORDER — LIDOCAINE HCL (CARDIAC) 20 MG/ML IV SOLN
INTRAVENOUS | Status: DC | PRN
  Administered 2012-08-27 (×2): 20 mg via INTRAVENOUS

## 2012-08-27 MED ORDER — KETOROLAC TROMETHAMINE 30 MG/ML IJ SOLN
INTRAMUSCULAR | Status: DC | PRN
  Administered 2012-08-27: 30 mg via INTRAVENOUS

## 2012-08-27 MED ORDER — ONDANSETRON HCL 4 MG/2ML IJ SOLN
INTRAMUSCULAR | Status: DC | PRN
  Administered 2012-08-27: 4 mg via INTRAVENOUS

## 2012-08-27 MED ORDER — FENTANYL CITRATE 0.05 MG/ML IJ SOLN
25.0000 ug | INTRAMUSCULAR | Status: DC | PRN
  Administered 2012-08-27 (×2): 50 ug via INTRAVENOUS

## 2012-08-27 MED ORDER — PROPOFOL 10 MG/ML IV EMUL
INTRAVENOUS | Status: DC | PRN
  Administered 2012-08-27: 20 mg via INTRAVENOUS
  Administered 2012-08-27: 180 mg via INTRAVENOUS

## 2012-08-27 MED ORDER — FENTANYL CITRATE 0.05 MG/ML IJ SOLN
INTRAMUSCULAR | Status: AC
  Administered 2012-08-27: 50 ug via INTRAVENOUS
  Filled 2012-08-27: qty 2

## 2012-08-27 MED ORDER — FENTANYL CITRATE 0.05 MG/ML IJ SOLN
INTRAMUSCULAR | Status: DC | PRN
  Administered 2012-08-27 (×2): 50 ug via INTRAVENOUS

## 2012-08-27 MED ORDER — MIDAZOLAM HCL 5 MG/5ML IJ SOLN
INTRAMUSCULAR | Status: DC | PRN
  Administered 2012-08-27: 2 mg via INTRAVENOUS

## 2012-08-27 MED ORDER — KETOROLAC TROMETHAMINE 30 MG/ML IJ SOLN: 15.0000 mg | Freq: Once | INTRAMUSCULAR | Status: DC | PRN

## 2012-08-27 MED ORDER — SODIUM CHLORIDE 0.9 % IR SOLN
Status: DC | PRN
  Administered 2012-08-27: 1

## 2012-08-27 MED ORDER — KETOROLAC TROMETHAMINE 30 MG/ML IJ SOLN
INTRAMUSCULAR | Status: AC
  Filled 2012-08-27: qty 1

## 2012-08-27 MED ORDER — LACTATED RINGERS IV SOLN
INTRAVENOUS | Status: DC
  Administered 2012-08-27: 09:00:00 via INTRAVENOUS

## 2012-08-27 MED ORDER — LIDOCAINE HCL 2 % IJ SOLN
INTRAMUSCULAR | Status: DC | PRN
  Administered 2012-08-27: 20 mL

## 2012-08-27 MED ORDER — PROPOFOL 10 MG/ML IV EMUL
INTRAVENOUS | Status: AC
  Filled 2012-08-27: qty 20

## 2012-08-27 SURGICAL SUPPLY — 18 items
BLADE INCISOR TRUC PLUS 2.9 (ABLATOR) IMPLANT
CATH ROBINSON RED A/P 16FR (CATHETERS) ×2 IMPLANT
CLOTH BEACON ORANGE TIMEOUT ST (SAFETY) ×2 IMPLANT
CONTAINER PREFILL 10% NBF 60ML (FORM) ×4 IMPLANT
DRESSING TELFA 8X3 (GAUZE/BANDAGES/DRESSINGS) ×2 IMPLANT
ELECT REM PT RETURN 9FT ADLT (ELECTROSURGICAL) ×2
ELECTRODE REM PT RTRN 9FT ADLT (ELECTROSURGICAL) ×1 IMPLANT
GLOVE BIO SURGEON STRL SZ 6.5 (GLOVE) ×2 IMPLANT
GLOVE BIOGEL PI IND STRL 7.0 (GLOVE) ×2 IMPLANT
GLOVE BIOGEL PI INDICATOR 7.0 (GLOVE) ×2
GOWN STRL REIN XL XLG (GOWN DISPOSABLE) ×4 IMPLANT
INCISOR TRUC PLUS BLADE 2.9 (ABLATOR) ×2
KIT HYSTEROSCOPY TRUCLEAR (ABLATOR) ×1 IMPLANT
PACK VAGINAL MINOR WOMEN LF (CUSTOM PROCEDURE TRAY) ×1 IMPLANT
PAD OB MATERNITY 4.3X12.25 (PERSONAL CARE ITEMS) ×2 IMPLANT
SYR 20CC LL (SYRINGE) ×2 IMPLANT
TOWEL OR 17X24 6PK STRL BLUE (TOWEL DISPOSABLE) ×4 IMPLANT
WATER STERILE IRR 1000ML POUR (IV SOLUTION) ×2 IMPLANT

## 2012-08-27 NOTE — H&P (View-Only) (Signed)
  Diana Cherry is a 36 y.o. female G3P3003 who presents for hysteroscopy, dilatation, curettage and polypectomy because of menorrhagia and endometrial polyp. Over the past year, patient reports longer and heavier periods with them now lasting 7 days and requiring a super pad change every 30 minutes.  She denies cramping, urinary tract symptoms, or changes in bowel movements,  but states she feels very sore during her period and has started to bleed every two weeks for seven days.  A  07/22/12,  sonohysterogram showed  uterus 9.60 x 6.11 x 5.15 cm, endometrium 0.755 cm with a solid focus in the lower uterine segment consistent with a polyp-0.93 x 0.61 x 0.48 cm; both ovaries appeared normal. A TSH and Prolactin were normal as was a CBC except H/H=11.6/35.1.  After review of sonogram findings and consideration of medical and surgical interventions available, the patient opted to proceed with surgical management.   Past Medical History  OB History: G3P3003 C-section x 3;  1996, 1999 and 2002  GYN History: menarche: 10    LMP: 08/09/12; Contracepton: Tubal Sterilization  The patient denies history of sexually transmitted disease.  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 Gabapentin (NEURONTIN PO) Take by mouth.      . OxyCODONE HCl (OXYCONTIN PO) Take by mouth.        No Known Allergies Denies sensitivity to  peanuts, shellfish, soy, latex or adhesives.   ROS: Denies headache, vision changes, nasal congestion, dysphagia, tinnitus, dizziness, hoarseness, cough,  chest pain, shortness of breath, nausea, vomiting, diarrhea,constipation,  urinary frequency, urgency  dysuria, hematuria, vaginitis symptoms, pelvic pain, swelling of joints,easy bruising,  myalgias, arthralgias, skin rashes, unexplained weight loss and except as is mentioned in the history of present illness, patient's review of systems is otherwise negative.  Physical Exam    BP 140/88  Pulse 84  Temp 98.2 F (36.8 C) (Oral)  Resp 14  Ht 5' 4" (1.626 m)  Wt 211 lb (95.709 kg)  BMI 36.22 kg/m2  LMP 08/09/2012  Neck: supple without masses or thyromegaly Lungs: clear to auscultation Heart: regular rate and rhythm Abdomen: soft, non-tender and no organomegaly Pelvic:EGBUS- wnl; vagina-normal rugae; uterus-normal size, cervix without lesions or motion tenderness; adnexae-no tenderness or masses Extremities:  no clubbing, cyanosis or edema   Assesment:  Menorrhagia                        Endometrial Mass   Disposition:  A discussion was held with patient regarding the indication for her procedure(s) along with the risks, which include but are not limited to: reaction to anesthesia, damage to adjacent organs, infection, excessive bleeding and endometrial scarring.  Patient has consented to proceed with a hysteroscopy, dilatation, curettage and polypectomy using Tru-Clear at [facility] on August 27, 2012 at 9 a.m.   CSN# [id number]   Michell Giuliano J. Serenitee Fuertes, PA-C  for Dr. Naima A. Dillard  

## 2012-08-27 NOTE — Interval H&P Note (Signed)
History and Physical Interval Note:  08/27/2012 9:24 AM  Terina E Leota Jacobsen  has presented today for surgery, with the diagnosis of Menorrhagia  The various methods of treatment have been discussed with the patient and family. After consideration of risks, benefits and other options for treatment, the patient has consented to  Procedure(s) (LRB) with comments: DILATATION & CURETTAGE/HYSTEROSCOPY WITH RESECTOCOPE (N/A) - D&C Hysteroscopy; polypectomy with TruClear - 1hr  as a surgical intervention .  The patient's history has been reviewed, patient examined, no change in status, stable for surgery.  I have reviewed the patient's chart and labs.  Questions were answered to the patient's satisfaction.     [id number] A  Date of Initial H&P: 08/20/12  History reviewed, patient examined, no change in status, stable for surgery.

## 2012-08-27 NOTE — Transfer of Care (Signed)
Immediate Anesthesia Transfer of Care Note  Patient: Diana Cherry  Procedure(s) Performed: Procedure(s) (LRB) with comments: DILATATION & CURETTAGE/HYSTEROSCOPY WITH RESECTOCOPE (N/A) - D&C Hysteroscopy; polypectomy with TruClear - 1hr   Patient Location: PACU  Anesthesia Type:General  Level of Consciousness: awake, oriented and sedated  Airway & Oxygen Therapy: Patient Spontanous Breathing and Patient connected to nasal cannula oxygen  Post-op Assessment: Report given to PACU RN and Post -op Vital signs reviewed and stable  Post vital signs: Reviewed and stable  Complications: No apparent anesthesia complications

## 2012-08-27 NOTE — Op Note (Signed)
Pre op DX: Menorrhagia and endometrial polyp   Post Op KG:MWNU   PHYSICIAN : Angelyne Terwilliger   ASSISTANTS: none   ANESTHESIA:   General with LMA and paracervical block  ESTIMATED BLOOD LOSS: minimal  Hysteroscopic fluid deficit:  250 cc  LOCAL MEDICATIONS USED:  LIDOCAINE 20CC  SPECIMEN:  Source of Specimen:  endometrial curettings and polyp  DISPOSITION OF SPECIMEN:  PATHOLOGY  COUNTS Correct:  YES    DICTATION #: The patient was taken to the operating room and prepped and draped in a normal sterile fashion. An in out catheter was used to drain the bladder.   A bivalve speculum was placed into the vagina and anterior lip of the cervix was grasped with a single-tooth tenaculum.  20 cc of 2% lidocaine was used for cervical block.  the cervix was then dilated with Shawnie Pons dilators up to 17. The hysteroscope was placed into the uterine cavity. The  entire uterus and both ostia were visualized. There was a small polyp on the anterior wall of the uterus.  This was removed with true clear.  The Rep was present.    Hyseroscope was then removed from the uterus. A sharp curettage was then done with a curette and endometrial curettings were obtained. The endometrial curettings were sent to pathology. Again the hysteroscope was placed into the uterine cavity. Both ostia were again visualized there were no more polyps or submucosal fibroids or endometrial masses seen. The tenaculum was removed from the cervix and hemostasis was noted.   PLAN OF CARE: discharge to home  PATIENT DISPOSITION:  PACU - hemodynamically stable.

## 2012-08-27 NOTE — Anesthesia Preprocedure Evaluation (Signed)
Anesthesia Evaluation  Patient identified by MRN, date of birth, ID band Patient awake    Reviewed: Allergy & Precautions, H&P , NPO status , Patient's Chart, lab work & pertinent test results, reviewed documented beta blocker date and time   Airway Mallampati: III TM Distance: >3 FB Neck ROM: full    Dental  (+) Teeth Intact   Pulmonary neg pulmonary ROS,  breath sounds clear to auscultation        Cardiovascular Exercise Tolerance: Good negative cardio ROS  Rhythm:regular Rate:Normal     Neuro/Psych  Neuromuscular disease (bilateral carpal tunnel - had right released 2 weeks ago, needs to have left side done) negative psych ROS   GI/Hepatic negative GI ROS, Neg liver ROS,   Endo/Other  Obese (BMI 36.3)  Renal/GU negative Renal ROS  Female GU complaint     Musculoskeletal   Abdominal   Peds  Hematology negative hematology ROS (+)   Anesthesia Other Findings Taking gabapentin, oxycodone and hydrocodone post-op from right carpal tunnel release two weeks ago  Reproductive/Obstetrics negative OB ROS                           Anesthesia Physical Anesthesia Plan  ASA: II  Anesthesia Plan: General LMA   Post-op Pain Management:    Induction:   Airway Management Planned:   Additional Equipment:   Intra-op Plan:   Post-operative Plan:   Informed Consent: I have reviewed the patients History and Physical, chart, labs and discussed the procedure including the risks, benefits and alternatives for the proposed anesthesia with the patient or authorized representative who has indicated his/her understanding and acceptance.   Dental Advisory Given  Plan Discussed with: Surgeon and CRNA  Anesthesia Plan Comments:         Anesthesia Quick Evaluation

## 2012-08-27 NOTE — Anesthesia Postprocedure Evaluation (Signed)
  Anesthesia Post-op Note  Patient: Diana Cherry  Procedure(s) Performed: Procedure(s) (LRB) with comments: DILATATION & CURETTAGE/HYSTEROSCOPY WITH RESECTOCOPE (N/A) - D&C Hysteroscopy; polypectomy with TruClear - 1hr   Patient Location: PACU  Anesthesia Type:General  Level of Consciousness: awake, alert  and oriented  Airway and Oxygen Therapy: Patient Spontanous Breathing  Post-op Pain: none  Post-op Assessment: Post-op Vital signs reviewed, Patient's Cardiovascular Status Stable, Respiratory Function Stable, Patent Airway, No signs of Nausea or vomiting and Pain level controlled  Post-op Vital Signs: Reviewed and stable  Complications: No apparent anesthesia complications

## 2012-08-29 ENCOUNTER — Encounter (HOSPITAL_COMMUNITY): Payer: Self-pay | Admitting: Obstetrics and Gynecology

## 2012-09-12 ENCOUNTER — Encounter: Payer: Self-pay | Admitting: Obstetrics and Gynecology

## 2012-09-12 ENCOUNTER — Ambulatory Visit (INDEPENDENT_AMBULATORY_CARE_PROVIDER_SITE_OTHER): Payer: BC Managed Care – PPO | Admitting: Obstetrics and Gynecology

## 2012-09-12 VITALS — BP 142/96 | HR 86 | Temp 99.0°F | Wt 210.0 lb

## 2012-09-12 DIAGNOSIS — N840 Polyp of corpus uteri: Secondary | ICD-10-CM

## 2012-09-12 NOTE — Progress Notes (Signed)
DATE OF SURGERY: 08/27/12 TYPE OF SURGERY:hysteroscopy d&c  PAIN:No VAG BLEEDING: yes VAG DISCHARGE: no NORMAL GI FUNCTN: yes NORMAL GU FUNCTN: yes Diagnosis Endometrium, curettage, and polyp - BENIGN ENDOMETRIAL POLYP AND ADJACENT BENIGN SECRETORY ENDOMETRIUM, NO ATYPIA, HYPERPLASIA OR MALIGNANCY. Physical Examination: Abdomen - soft, nontender, nondistended, no masses or organomegaly Pelvic - normal external genitalia, vulva, vagina, cervix, uterus and adnexa BP 142/96  Pulse 86  Temp 99 F (37.2 C) (Oral)  Wt 210 lb (95.255 kg)  LMP 09/03/2012 Pt doing well  RT  To normal activity Refer to PCP for evaluation of HTN Plan endometrial ablation if menorrhagia returns

## 2012-10-18 ENCOUNTER — Encounter: Payer: Medicaid Other | Admitting: Obstetrics and Gynecology

## 2012-11-06 ENCOUNTER — Encounter: Payer: Self-pay | Admitting: Obstetrics and Gynecology

## 2013-11-11 ENCOUNTER — Other Ambulatory Visit: Payer: Self-pay | Admitting: Family

## 2013-11-11 DIAGNOSIS — Z1231 Encounter for screening mammogram for malignant neoplasm of breast: Secondary | ICD-10-CM

## 2013-11-28 ENCOUNTER — Ambulatory Visit: Payer: Medicaid Other

## 2013-12-16 ENCOUNTER — Ambulatory Visit: Payer: Medicaid Other

## 2014-02-17 ENCOUNTER — Other Ambulatory Visit (HOSPITAL_COMMUNITY)
Admission: RE | Admit: 2014-02-17 | Discharge: 2014-02-17 | Disposition: A | Discharge status: Home / Self Care | Payer: BC Managed Care – PPO | Source: Ambulatory Visit | Attending: Family Medicine | Admitting: Family Medicine

## 2014-02-17 ENCOUNTER — Other Ambulatory Visit: Payer: Self-pay | Admitting: Family Medicine

## 2014-02-17 DIAGNOSIS — Z124 Encounter for screening for malignant neoplasm of cervix: Secondary | ICD-10-CM | POA: Insufficient documentation

## 2014-04-16 ENCOUNTER — Ambulatory Visit
Admission: RE | Admit: 2014-04-16 | Discharge: 2014-04-16 | Disposition: A | Discharge status: Home / Self Care | Payer: BC Managed Care – PPO | Source: Ambulatory Visit | Attending: Family | Admitting: Family

## 2014-04-16 DIAGNOSIS — Z1231 Encounter for screening mammogram for malignant neoplasm of breast: Secondary | ICD-10-CM

## 2014-08-03 ENCOUNTER — Encounter: Payer: Self-pay | Admitting: Obstetrics and Gynecology

## 2016-10-07 ENCOUNTER — Emergency Department (HOSPITAL_BASED_OUTPATIENT_CLINIC_OR_DEPARTMENT_OTHER)
Admission: EM | Admit: 2016-10-07 | Discharge: 2016-10-08 | Disposition: A | Discharge status: Home / Self Care | Payer: Self-pay | Attending: Emergency Medicine | Admitting: Emergency Medicine

## 2016-10-07 ENCOUNTER — Encounter (HOSPITAL_BASED_OUTPATIENT_CLINIC_OR_DEPARTMENT_OTHER): Payer: Self-pay | Admitting: Emergency Medicine

## 2016-10-07 DIAGNOSIS — I10 Essential (primary) hypertension: Secondary | ICD-10-CM | POA: Insufficient documentation

## 2016-10-07 DIAGNOSIS — R42 Dizziness and giddiness: Secondary | ICD-10-CM | POA: Insufficient documentation

## 2016-10-07 DIAGNOSIS — R519 Headache, unspecified: Secondary | ICD-10-CM

## 2016-10-07 DIAGNOSIS — Z791 Long term (current) use of non-steroidal anti-inflammatories (NSAID): Secondary | ICD-10-CM | POA: Insufficient documentation

## 2016-10-07 DIAGNOSIS — R110 Nausea: Secondary | ICD-10-CM | POA: Insufficient documentation

## 2016-10-07 DIAGNOSIS — R51 Headache: Secondary | ICD-10-CM | POA: Insufficient documentation

## 2016-10-07 HISTORY — DX: Essential (primary) hypertension: I10

## 2016-10-07 LAB — COMPREHENSIVE METABOLIC PANEL
ALT: 13 U/L — ABNORMAL LOW (ref 14–54)
AST: 17 U/L (ref 15–41)
Albumin: 3.9 g/dL (ref 3.5–5.0)
Alkaline Phosphatase: 44 U/L (ref 38–126)
Anion gap: 10 (ref 5–15)
BUN: 12 mg/dL (ref 6–20)
CO2: 22 mmol/L (ref 22–32)
Calcium: 9.2 mg/dL (ref 8.9–10.3)
Chloride: 102 mmol/L (ref 101–111)
Creatinine, Ser: 0.78 mg/dL (ref 0.44–1.00)
GFR calc Af Amer: >60 mL/min (ref >60)
GFR calc non Af Amer: >60 mL/min (ref >60)
Glucose, Bld: 105 mg/dL — ABNORMAL HIGH (ref 65–99)
Potassium: 3.9 mmol/L (ref 3.5–5.1)
Sodium: 134 mmol/L — ABNORMAL LOW (ref 135–145)
Total Bilirubin: 0.4 mg/dL (ref 0.3–1.2)
Total Protein: 7.6 g/dL (ref 6.5–8.1)

## 2016-10-07 LAB — CBC WITH DIFFERENTIAL/PLATELET
Basophils Absolute: 0 10*3/uL (ref 0.0–0.1)
Basophils Relative: 1 %
Eosinophils Absolute: 0 10*3/uL (ref 0.0–0.7)
Eosinophils Relative: 1 %
HCT: 34.5 % — ABNORMAL LOW (ref 36.0–46.0)
Hemoglobin: 11.1 g/dL — ABNORMAL LOW (ref 12.0–15.0)
Lymphocytes Relative: 14 %
Lymphs Abs: 0.9 10*3/uL (ref 0.7–4.0)
MCH: 27.1 pg (ref 26.0–34.0)
MCHC: 32.2 g/dL (ref 30.0–36.0)
MCV: 84.1 fL (ref 78.0–100.0)
Monocytes Absolute: 0.5 10*3/uL (ref 0.1–1.0)
Monocytes Relative: 8 %
Neutro Abs: 4.9 10*3/uL (ref 1.7–7.7)
Neutrophils Relative %: 78 %
Platelets: 184 10*3/uL (ref 150–400)
RBC: 4.1 MIL/uL (ref 3.87–5.11)
RDW: 14.6 % (ref 11.5–15.5)
WBC: 6.3 10*3/uL (ref 4.0–10.5)

## 2016-10-07 MED ORDER — IBUPROFEN 400 MG PO TABS
400.0000 mg | ORAL_TABLET | Freq: Once | ORAL | Status: AC | PRN
  Administered 2016-10-07: 400 mg via ORAL
  Filled 2016-10-07: qty 1

## 2016-10-07 NOTE — ED Triage Notes (Addendum)
Constant headache since yesterday at base of skull radiating around the left side with associated dizziness. Endorses nausea, taking tylenol without relief. Pt states she ran out of BP meds 2 months ago. Pt states she just doesn't feel well.

## 2016-10-07 NOTE — ED Provider Notes (Signed)
[facility] Provider Note   CSN: [id number] Arrival date & time: 10/07/16  1948  By signing my name below, I, Diana Cherry, attest that this documentation has been prepared under the direction and in the presence of Diana Bleacher, PA-C Electronically Signed: Soijett Cherry, ED Scribe. 10/07/16. 11:41 PM.  History   Chief Complaint Chief Complaint  Patient presents with  . Headache    HPI Diana Cherry is a 41 y.o. female with a PMHx of HTN, who presents to the Emergency Department complaining of gradually worsening, throbbing, HA onset yesterday morning. Pt HA is worsened with position change and neck movement. Pt notes that her current HA isn't similar to past headaches. HA started as a mild headache and gradually worsened. Pt is having associated symptoms of dizziness, nausea, and resolved photophobia. She has tried tylenol, 2 BC powders, and theraflu with no relief of her symptoms. She denies vision change, weakness, hitting her head, fever, and any other symptoms. Denies hx of migraines or family hx of aneurysms.   The history is provided by the patient. No language interpreter was used.    Past Medical History:  Diagnosis Date  . Abnormal Pap smear   . Anemia    h/o  . Anesthesia complication    PT STATES C/O ITCHING WITH ANESTHESIA  . Bilateral carpal tunnel syndrome   . Hypertension   . Irregular menses     Patient Active Problem List   Diagnosis Date Noted  . Screening for malignant neoplasm of the cervix 07/05/2012  . Obesity (BMI 30-39.9) 07/05/2012  . Carpal tunnel syndrome 07/05/2012  . Irregular bleeding 07/05/2012    Past Surgical History:  Procedure Laterality Date  . CARPEL TUNNEL    . CESAREAN SECTION    . DILATATION & CURRETTAGE/HYSTEROSCOPY WITH RESECTOCOPE  08/27/2012   Procedure: DILATATION & CURETTAGE/HYSTEROSCOPY WITH RESECTOCOPE;  Surgeon: Michael Litter, MD;  Location: [facility];  Service: Gynecology;  Laterality: N/A;  D&C  Hysteroscopy; polypectomy with TruClear - 1hr   . TUBAL LIGATION      OB History    Gravida Para Term Preterm AB Living   3 3 3  0 0 3   SAB TAB Ectopic Multiple Live Births   0 0 0 0         Home Medications    Prior to Admission medications   Medication Sig Start Date End Date Taking? Authorizing Provider  gabapentin (NEURONTIN) 300 MG capsule Take 300 mg by mouth 3 (three) times daily.    Historical Provider, MD  HYDROcodone-acetaminophen (NORCO/VICODIN) 5-325 MG per tablet Take 1 tablet by mouth every 6 (six) hours as needed. For pain    Historical Provider, MD  ibuprofen (ADVIL,MOTRIN) 600 MG tablet Take 1 tablet (600 mg total) by mouth every 6 (six) hours as needed for pain. 08/27/12   [name], MD  oxyCODONE-acetaminophen (PERCOCET/ROXICET) 5-325 MG per tablet Take 1 tablet by mouth every 4 (four) hours as needed.    Historical Provider, MD    Family History Family History  Problem Relation Age of Onset  . Hypertension Mother   . Lupus Mother   . Hypertension Paternal Grandfather   . Hypertension Paternal Grandmother   . Hypertension Maternal Grandmother   . Diabetes Maternal Grandmother     Social History Social History  Substance Use Topics  . Smoking status: Never Smoker  . Smokeless tobacco: Never Used  . Alcohol use No     Allergies   Patient has no  known allergies.   Review of Systems Review of Systems  Constitutional: Negative for fever.  HENT: Negative for congestion, dental problem, rhinorrhea and sinus pressure.   Eyes: Positive for photophobia (resolved). Negative for discharge, redness and visual disturbance.  Respiratory: Negative for shortness of breath.   Cardiovascular: Negative for chest pain.  Gastrointestinal: Positive for nausea. Negative for vomiting.  Musculoskeletal: Negative for gait problem, neck pain and neck stiffness.  Skin: Negative for rash.  Neurological: Positive for dizziness and headaches. Negative for syncope,  speech difficulty, weakness, light-headedness and numbness.  Psychiatric/Behavioral: Negative for confusion.     Physical Exam Updated Vital Signs BP (!) 157/103 (BP Location: Right Arm)   Pulse 96   Temp 98.7 F (37.1 C) (Oral)   Resp 20   Ht 5\' 5"  (1.651 m)   Wt 195 lb (88.5 kg)   LMP 09/20/2016   SpO2 100%   BMI 32.45 kg/m   Physical Exam  Constitutional: She is oriented to person, place, and time. She appears well-developed and well-nourished. No distress.  HENT:  Head: Normocephalic and atraumatic.  Right Ear: Tympanic membrane, external ear and ear canal normal.  Left Ear: Tympanic membrane, external ear and ear canal normal.  Nose: Nose normal.  Mouth/Throat: Uvula is midline, oropharynx is clear and moist and mucous membranes are normal.  Eyes: Conjunctivae, EOM and lids are normal. Pupils are equal, round, and reactive to light. Right eye exhibits no nystagmus. Left eye exhibits no nystagmus.  Neck: Trachea normal and normal range of motion. Neck supple. Carotid bruit is not present. No Brudzinski's sign and no Kernig's sign noted.  Cardiovascular: Normal rate and regular rhythm.   Pulmonary/Chest: Effort normal and breath sounds normal. No respiratory distress.  Abdominal: Soft. She exhibits no distension. There is no tenderness.  Musculoskeletal: Normal range of motion.       Cervical back: She exhibits normal range of motion, no tenderness and no bony tenderness.  Neurological: She is alert and oriented to person, place, and time. She has normal strength and normal reflexes. No cranial nerve deficit or sensory deficit. She displays a negative Romberg sign. Coordination and gait normal. GCS eye subscore is 4. GCS verbal subscore is 5. GCS motor subscore is 6.  Skin: Skin is warm and dry.  Psychiatric: She has a normal mood and affect. Her behavior is normal.  Nursing note and vitals reviewed.    ED Treatments / Results  DIAGNOSTIC STUDIES: Oxygen Saturation is  100% on RA, nl by my interpretation.    COORDINATION OF CARE: 12:03 AM Discussed treatment plan with pt at bedside which includes labs, CT head, and pt agreed to plan. Patient appears very well at time of exam. We discussed limitations of CT including reduce sensitivity for intracranial bleeding after 4-6 hours. Discussed that in order to fully evaluate this we would need to do a lumbar puncture. However, she is very well-appearing with no neuro deficits. We discussed risks and benefits of LP and given her well appearance, I do not feel that risk of intercranial bleeding would necessarily warrant LP if she feels better with treatment. Will reassess.   Labs (all labs ordered are listed, but only abnormal results are displayed) Labs Reviewed  COMPREHENSIVE METABOLIC PANEL - Abnormal; Notable for the following:       Result Value   Sodium 134 (*)    Glucose, Bld 105 (*)    ALT 13 (*)    All other components within normal limits  CBC  WITH DIFFERENTIAL/PLATELET - Abnormal; Notable for the following:    Hemoglobin 11.1 (*)    HCT 34.5 (*)    All other components within normal limits    Radiology Ct Head Wo Contrast  Result Date: 10/08/2016 CLINICAL DATA:  Headache dizziness EXAM: CT HEAD WITHOUT CONTRAST TECHNIQUE: Contiguous axial images were obtained from the base of the skull through the vertex without intravenous contrast. COMPARISON:  None. FINDINGS: Brain: No acute territorial infarction, intracranial hemorrhage or extra-axial fluid collection is seen. There is mild cortical atrophy. No focal mass, mass effect or midline shift. Ventricles are nonenlarged. Incidental note is made of an empty sella. Vascular: No hyperdense vessel or unexpected calcification. Skull: Normal. Negative for fracture or focal lesion. Sinuses/Orbits: No acute finding. Other: None IMPRESSION: No CT evidence for acute intracranial abnormality Electronically Signed   By: Jasmine PangKim  Fujinaga M.D.   On: 10/08/2016 00:57     Procedures Procedures (including critical care time)  Medications Ordered in ED Medications  ibuprofen (ADVIL,MOTRIN) tablet 400 mg (400 mg Oral Given 10/07/16 2229)     Initial Impression / Assessment and Plan / ED Course  I have reviewed the triage vital signs and the nursing notes.  Pertinent labs & imaging results that were available during my care of the patient were reviewed by me and considered in my medical decision making (see chart for details).  Clinical Course    1:00 AM patient informed of results. She is feeling better after treatment. Pain currently 3 out of 10. We re-visited LP. We agree that she can and will return if she has any worsening of her current symptoms or new symptoms. Especially concerning would be development of severe headache, vomiting, confusion, difficulty with ambulation. Patient encouraged to follow-up with her primary care physician next week if her headaches continue but are not particularly worse. Patient and family at bedside verbalized understanding and are in agreement with this plan.  Final Clinical Impressions(s) / ED Diagnoses   Final diagnoses:  Acute nonintractable headache, unspecified headache type   Patient with atypical HA. Patient without other high-risk features of headache including: sudden onset/thunderclap HA, altered mental status, accompanying seizure, headache with exertion, age > 6750, history of immunocompromise, fever, use of anticoagulation, family history of spontaneous SAH, concomitant drug use, toxic exposure.   Patient has a normal complete neurological exam, normal vital signs, normal level of consciousness, no signs of meningismus, is well-appearing/non-toxic appearing, no signs of trauma.  CT performed and is negative for acute bleed or mass lesion. Given patient appearance, after discussion with patient, feel that risks of LP is not worth the risk of occult bleeding. Patient seems reliable and willing to return if her  symptoms worsen. Will defer LP at this time. She is ready to go home and rest.   No dangerous or life-threatening conditions suspected or identified by history, physical exam, and by work-up. No indications for hospitalization identified.    New Prescriptions Discharge Medication List as of 10/08/2016  1:17 AM     I personally performed the services described in this documentation, which was scribed in my presence. The recorded information has been reviewed and is accurate.     Renne CriglerJoshua Shiza Thelen, PA-C 10/08/16 1501    Paula LibraJohn Molpus, MD 10/08/16 2239

## 2016-10-07 NOTE — ED Notes (Signed)
States took Theraflu at around 1200 noon. Tylenol at 1600. BC powders as well last night without relief.

## 2016-10-08 ENCOUNTER — Emergency Department (HOSPITAL_BASED_OUTPATIENT_CLINIC_OR_DEPARTMENT_OTHER): Payer: Self-pay

## 2016-10-08 MED ORDER — SODIUM CHLORIDE 0.9 % IV BOLUS (SEPSIS)
1000.0000 mL | Freq: Once | INTRAVENOUS | Status: AC
Start: 2016-10-08 — End: 2016-10-08
  Administered 2016-10-08: 1000 mL via INTRAVENOUS

## 2016-10-08 MED ORDER — METOCLOPRAMIDE HCL 5 MG/ML IJ SOLN
10.0000 mg | Freq: Once | INTRAMUSCULAR | Status: AC
  Administered 2016-10-08: 10 mg via INTRAVENOUS
  Filled 2016-10-08: qty 2

## 2016-10-08 MED ORDER — DIPHENHYDRAMINE HCL 50 MG/ML IJ SOLN
25.0000 mg | Freq: Once | INTRAMUSCULAR | Status: AC
  Administered 2016-10-08: 25 mg via INTRAVENOUS
  Filled 2016-10-08: qty 1

## 2016-10-08 NOTE — ED Notes (Signed)
Pt given d/c instructions as per chart. Verbalizes understanding. No questions. 

## 2016-10-08 NOTE — ED Notes (Signed)
Patient transported to CT 

## 2016-10-08 NOTE — Discharge Instructions (Signed)
Please read and follow all provided instructions.  Your diagnoses today include:  1. Acute nonintractable headache, unspecified headache type     Tests performed today include:  CT of your head which was normal and did not show any serious cause of your headache  Vital signs. See below for your results today.   Medications:  In the Emergency Department you received:  Reglan - antinausea/headache medication  Benadryl - antihistamine to counteract potential side effects of reglan  Take any prescribed medications only as directed.  Additional information:  Follow any educational materials contained in this packet.  You are having a headache. No specific cause was found today for your headache. It may have been a migraine or other cause of headache. Stress, anxiety, fatigue, and depression are common triggers for headaches.   Your headache today does not appear to be life-threatening or require hospitalization, but often the exact cause of headaches is not determined in the emergency department. Therefore, follow-up with your doctor is very important to find out what may have caused your headache and whether or not you need any further diagnostic testing or treatment.   Sometimes headaches can appear benign (not harmful), but then more serious symptoms can develop which should prompt an immediate re-evaluation by your doctor or the emergency department.  BE VERY CAREFUL not to take multiple medicines containing Tylenol (also called acetaminophen). Doing so can lead to an overdose which can damage your liver and cause liver failure and possibly death.   Follow-up instructions: Please follow-up with your primary care provider in the next 3 days for further evaluation of your symptoms.   Return instructions:   Please return to the Emergency Department if you experience worsening symptoms.  Return if the medications do not resolve your headache, if it recurs, or if you have multiple  episodes of vomiting or cannot keep down fluids.  Return if you have a change from the usual headache.  RETURN IMMEDIATELY IF you:  Develop a sudden, severe headache  Develop confusion or become poorly responsive or faint  Develop a fever above 100.51F or problem breathing  Have a change in speech, vision, swallowing, or understanding  Develop new weakness, numbness, tingling, incoordination in your arms or legs  Have a seizure  Please return if you have any other emergent concerns.  Additional Information:  Your vital signs today were: BP (!) 157/103 (BP Location: Right Arm)    Pulse 96    Temp 98.7 F (37.1 C) (Oral)    Resp 20    Ht 5\' 5"  (1.651 m)    Wt 88.5 kg    LMP 09/20/2016    SpO2 100%    BMI 32.45 kg/m  If your blood pressure (BP) was elevated above 135/85 this visit, please have this repeated by your doctor within one month. --------------

## 2016-12-29 ENCOUNTER — Other Ambulatory Visit: Payer: Self-pay | Admitting: Otolaryngology

## 2016-12-29 DIAGNOSIS — G9600 Cerebrospinal fluid leak, unspecified: Secondary | ICD-10-CM

## 2017-01-09 ENCOUNTER — Ambulatory Visit
Admission: RE | Admit: 2017-01-09 | Discharge: 2017-01-09 | Disposition: A | Discharge status: Home / Self Care | Payer: No Typology Code available for payment source | Source: Ambulatory Visit | Attending: Otolaryngology | Admitting: Otolaryngology

## 2017-01-09 DIAGNOSIS — G9600 Cerebrospinal fluid leak, unspecified: Secondary | ICD-10-CM

## 2018-02-05 DIAGNOSIS — Z1231 Encounter for screening mammogram for malignant neoplasm of breast: Secondary | ICD-10-CM | POA: Diagnosis not present

## 2018-02-05 DIAGNOSIS — Z01419 Encounter for gynecological examination (general) (routine) without abnormal findings: Secondary | ICD-10-CM | POA: Diagnosis not present

## 2018-02-05 DIAGNOSIS — Z124 Encounter for screening for malignant neoplasm of cervix: Secondary | ICD-10-CM | POA: Diagnosis not present

## 2018-02-05 DIAGNOSIS — N921 Excessive and frequent menstruation with irregular cycle: Secondary | ICD-10-CM | POA: Diagnosis not present

## 2018-02-05 DIAGNOSIS — Z113 Encounter for screening for infections with a predominantly sexual mode of transmission: Secondary | ICD-10-CM | POA: Diagnosis not present

## 2018-02-12 ENCOUNTER — Other Ambulatory Visit: Payer: Self-pay | Admitting: Gastroenterology

## 2018-02-12 DIAGNOSIS — R1013 Epigastric pain: Secondary | ICD-10-CM | POA: Diagnosis not present

## 2018-02-12 DIAGNOSIS — R112 Nausea with vomiting, unspecified: Secondary | ICD-10-CM

## 2018-02-12 DIAGNOSIS — R110 Nausea: Secondary | ICD-10-CM | POA: Diagnosis not present

## 2018-02-12 DIAGNOSIS — K594 Anal spasm: Secondary | ICD-10-CM | POA: Diagnosis not present

## 2018-02-21 ENCOUNTER — Ambulatory Visit (HOSPITAL_COMMUNITY)
Admission: RE | Admit: 2018-02-21 | Discharge: 2018-02-21 | Disposition: A | Discharge status: Home / Self Care | Payer: BLUE CROSS/BLUE SHIELD | Source: Ambulatory Visit | Attending: Gastroenterology | Admitting: Gastroenterology

## 2018-02-21 ENCOUNTER — Encounter (HOSPITAL_COMMUNITY)
Admission: RE | Admit: 2018-02-21 | Discharge: 2018-02-21 | Disposition: A | Discharge status: Home / Self Care | Payer: BLUE CROSS/BLUE SHIELD | Source: Ambulatory Visit | Attending: Gastroenterology | Admitting: Gastroenterology

## 2018-02-21 DIAGNOSIS — R112 Nausea with vomiting, unspecified: Secondary | ICD-10-CM

## 2018-02-21 DIAGNOSIS — K760 Fatty (change of) liver, not elsewhere classified: Secondary | ICD-10-CM | POA: Insufficient documentation

## 2018-02-21 MED ORDER — TECHNETIUM TC 99M MEBROFENIN IV KIT
5.0000 | PACK | Freq: Once | INTRAVENOUS | Status: AC | PRN
  Administered 2018-02-21: 5 via INTRAVENOUS

## 2018-03-27 ENCOUNTER — Ambulatory Visit: Payer: BLUE CROSS/BLUE SHIELD | Admitting: Family Medicine

## 2018-04-09 ENCOUNTER — Encounter: Payer: Self-pay | Admitting: Family Medicine

## 2018-04-09 ENCOUNTER — Ambulatory Visit (INDEPENDENT_AMBULATORY_CARE_PROVIDER_SITE_OTHER): Payer: BLUE CROSS/BLUE SHIELD | Admitting: Family Medicine

## 2018-04-09 VITALS — BP 140/90 | HR 94 | Temp 98.5°F | Ht 64.0 in | Wt 225.0 lb

## 2018-04-09 DIAGNOSIS — Z7689 Persons encountering health services in other specified circumstances: Secondary | ICD-10-CM | POA: Diagnosis not present

## 2018-04-09 DIAGNOSIS — I10 Essential (primary) hypertension: Secondary | ICD-10-CM

## 2018-04-09 DIAGNOSIS — E049 Nontoxic goiter, unspecified: Secondary | ICD-10-CM | POA: Diagnosis not present

## 2018-04-09 MED ORDER — PROPRANOLOL HCL ER 60 MG PO CP24: 60.0000 mg | ORAL_CAPSULE | Freq: Every day | ORAL | 2 refills | Status: DC

## 2018-04-09 NOTE — Progress Notes (Signed)
Patient presents to clinic today to establish care.  SUBJECTIVE: PMH: Pt is a 42 yo female with pmh sig for HTN and esophogeal stricture.  [facility] [address].  Patient is also followed by OB/GYN, endorses recent Pap and mammogram.  HTN: -Previously on propranolol ER 80 mg daily -Has been off medication x3 months -Occasionally checks BP, typically 180 systolic -Patient knows BP is high when she has a headache.  History of esophageal stricture: -Underwent esophageal dilation -Endorses improvement  Allergies: NKDA  Social history: Patient has a Audiological scientistdaycare.  Patient denies alcohol, tobacco, drug use.   Past Medical History:  Diagnosis Date  . Abnormal Pap smear   . Anemia    h/o  . Anesthesia complication    PT STATES C/O ITCHING WITH ANESTHESIA  . Frequent headaches   . Hypertension   . Irregular menses     Past Surgical History:  Procedure Laterality Date  . CARPEL TUNNEL    . CESAREAN SECTION    . DILATATION & CURRETTAGE/HYSTEROSCOPY WITH RESECTOCOPE  08/27/2012   Procedure: DILATATION & CURETTAGE/HYSTEROSCOPY WITH RESECTOCOPE;  Surgeon: Michael LitterNaima A Dillard, MD;  Location: [facility];  Service: Gynecology;  Laterality: N/A;  D&C Hysteroscopy; polypectomy with TruClear - 1hr   . TUBAL LIGATION      Current Outpatient Medications on File Prior to Visit  Medication Sig Dispense Refill  . hyoscyamine (LEVSIN) 0.125 MG/5ML ELIX Take 0.125 mg by mouth 2 (two) times daily.    Marland Kitchen. PROPRANOLOL HCL ER PO Take 80 mg by mouth daily.     No current facility-administered medications on file prior to visit.     No Known Allergies  Family History  Problem Relation Age of Onset  . Hypertension Mother   . Lupus Mother   . Hypertension Paternal Grandfather   . Hypertension Paternal Grandmother   . Hypertension Maternal Grandmother   . Diabetes Maternal Grandmother     Social History   Socioeconomic History  . Marital status: Married   Spouse name: Not on file  . Number of children: Not on file  . Years of education: Not on file  . Highest education level: Not on file  Occupational History  . Not on file  Social Needs  . Financial resource strain: Not on file  . Food insecurity:    Worry: Not on file    Inability: Not on file  . Transportation needs:    Medical: Not on file    Non-medical: Not on file  Tobacco Use  . Smoking status: Never Smoker  . Smokeless tobacco: Never Used  Substance and Sexual Activity  . Alcohol use: No  . Drug use: No  . Sexual activity: Yes    Birth control/protection: Surgical    Comment: BTL  Lifestyle  . Physical activity:    Days per week: Not on file    Minutes per session: Not on file  . Stress: Not on file  Relationships  . Social connections:    Talks on phone: Not on file    Gets together: Not on file    Attends religious service: Not on file    Active member of club or organization: Not on file    Attends meetings of clubs or organizations: Not on file    Relationship status: Not on file  . Intimate partner violence:    Fear of current or ex partner: Not on file    Emotionally abused: Not on file  Physically abused: Not on file    Forced sexual activity: Not on file  Other Topics Concern  . Not on file  Social History Narrative  . Not on file    ROS General: Denies fever, chills, night sweats, changes in weight, changes in appetite HEENT: Denies headaches, ear pain, changes in vision, rhinorrhea, sore throat CV: Denies CP, palpitations, SOB, orthopnea Pulm: Denies SOB, cough, wheezing GI: Denies abdominal pain, nausea, vomiting, diarrhea, constipation GU: Denies dysuria, hematuria, frequency, vaginal discharge Msk: Denies muscle cramps, joint pains Neuro: Denies weakness, numbness, tingling Skin: Denies rashes, bruising Psych: Denies depression, anxiety, hallucinations  BP 140/90 (BP Location: Right Arm, Patient Position: Sitting, Cuff Size: Large)    Pulse 94   Temp 98.5 F (36.9 C) (Oral)   Ht 5\' 4"  (1.626 m)   Wt 225 lb (102.1 kg)   LMP 03/26/2018 (Exact Date)   SpO2 98%   BMI 38.62 kg/m   Physical Exam Gen. Pleasant, well developed, well-nourished, in NAD HEENT - [place]/AT, PERRL, TMs normal bilaterally.  No cervical lymphadenopathy.  Conjunctive clear, no scleral icterus, no nasal drainage, pharynx without erythema or exudate.  Right lobe with mild to moderate thyromegaly, no nodules noted. Lungs: no use of accessory muscles, CTAB, no wheezes, rales or rhonchi Cardiovascular: RRR, no r/g/m, no peripheral edema Abdomen: BS present, soft, nontender, nondistended Neuro:  A&Ox3, CN II-XII intact, normal gait  No results found for this or any previous visit (from the past 2160 hour(s)).  Assessment/Plan: Essential hypertension  -Elevated -We will restart propranolol at slightly lower dose -Patient encouraged to purchase a BP cuff for home monitoring and to keep a log to bring with her to each visit -Lifestyle modifications encouraged - Plan: propranolol ER (INDERAL LA) 60 MG 24 hr capsule -Follow-up in 1 month, sooner if needed  Encounter to establish care -We reviewed the PMH, PSH, FH, SH, Meds and Allergies. -We provided refills for any medications we will prescribe as needed. -We addressed current concerns per orders and patient instructions. -We have asked for records for pertinent exams, studies, vaccines and notes from previous providers. -We have advised patient to follow up per instructions below.  Goiter -Discussed obtaining TSH, T4. -Patient to check to see if she has had this done in the past.  If not will order.  Abbe Amsterdam, MD

## 2018-04-09 NOTE — Patient Instructions (Addendum)
Please obtain a blood pressure cuff to check your blood pressure daily at home.  You can keep a log of this to bring with you to your next appointment in 1 month.  How to Take Your Blood Pressure You can take your blood pressure at home with a machine. You may need to check your blood pressure at home:  To check if you have high blood pressure (hypertension).  To check your blood pressure over time.  To make sure your blood pressure medicine is working.  Supplies needed: You will need a blood pressure machine, or monitor. You can buy one at a drugstore or online. When choosing one:  Choose one with an arm cuff.  Choose one that wraps around your upper arm. Only one finger should fit between your arm and the cuff.  Do not choose one that measures your blood pressure from your wrist or finger.  Your doctor can suggest a monitor. How to prepare Avoid these things for 30 minutes before checking your blood pressure:  Drinking caffeine.  Drinking alcohol.  Eating.  Smoking.  Exercising.  Five minutes before checking your blood pressure:  Pee.  Sit in a dining chair. Avoid sitting in a soft couch or armchair.  Be quiet. Do not talk.  How to take your blood pressure Follow the instructions that came with your machine. If you have a digital blood pressure monitor, these may be the instructions: 1. Sit up straight. 2. Place your feet on the floor. Do not cross your ankles or legs. 3. Rest your left arm at the level of your heart. You may rest it on a table, desk, or chair. 4. Pull up your shirt sleeve. 5. Wrap the blood pressure cuff around the upper part of your left arm. The cuff should be 1 inch (2.5 cm) above your elbow. It is best to wrap the cuff around bare skin. 6. Fit the cuff snugly around your arm. You should be able to place only one finger between the cuff and your arm. 7. Put the cord inside the groove of your elbow. 8. Press the power button. 9. Sit quietly  while the cuff fills with air and loses air. 10. Write down the numbers on the screen. 11. Wait 2-3 minutes and then repeat steps 1-10.  What do the numbers mean? Two numbers make up your blood pressure. The first number is called systolic pressure. The second is called diastolic pressure. An example of a blood pressure reading is "120 over 80" (or 120/80). If you are an adult and do not have a medical condition, use this guide to find out if your blood pressure is normal: Normal  First number: below 120.  Second number: below 80. Elevated  First number: 120-129.  Second number: below 80. Hypertension stage 1  First number: 130-139.  Second number: 80-89. Hypertension stage 2  First number: 140 or above.  Second number: 90 or above. Your blood pressure is above normal even if only the top or bottom number is above normal. Follow these instructions at home:  Check your blood pressure as often as your doctor tells you to.  Take your monitor to your next doctor's appointment. Your doctor will: ? Make sure you are using it correctly. ? Make sure it is working right.  Make sure you understand what your blood pressure numbers should be.  Tell your doctor if your medicines are causing side effects. Contact a doctor if:  Your blood pressure keeps being high.  Get help right away if:  Your first blood pressure number is higher than 180.  Your second blood pressure number is higher than 120. This information is not intended to replace advice given to you by your health care provider. Make sure you discuss any questions you have with your health care provider. Document Released: 08/31/2008 Document Revised: 08/16/2016 Document Reviewed: 02/25/2016 Elsevier Interactive Patient Education  2018 [employer].  Managing Your Hypertension Hypertension is commonly called high blood pressure. This is when the force of your blood pressing against the walls of your arteries is too  strong. Arteries are blood vessels that carry blood from your heart throughout your body. Hypertension forces the heart to work harder to pump blood, and may cause the arteries to become narrow or stiff. Having untreated or uncontrolled hypertension can cause heart attack, stroke, kidney disease, and other problems. What are blood pressure readings? A blood pressure reading consists of a higher number over a lower number. Ideally, your blood pressure should be below 120/80. The first ("top") number is called the systolic pressure. It is a measure of the pressure in your arteries as your heart beats. The second ("bottom") number is called the diastolic pressure. It is a measure of the pressure in your arteries as the heart relaxes. What does my blood pressure reading mean? Blood pressure is classified into four stages. Based on your blood pressure reading, your health care provider may use the following stages to determine what type of treatment you need, if any. Systolic pressure and diastolic pressure are measured in a unit called mm Hg. Normal  Systolic pressure: below 120.  Diastolic pressure: below 80. Elevated  Systolic pressure: 120-129.  Diastolic pressure: below 80. Hypertension stage 1  Systolic pressure: 130-139.  Diastolic pressure: 80-89. Hypertension stage 2  Systolic pressure: 140 or above.  Diastolic pressure: 90 or above. What health risks are associated with hypertension? Managing your hypertension is an important responsibility. Uncontrolled hypertension can lead to:  A heart attack.  A stroke.  A weakened blood vessel (aneurysm).  Heart failure.  Kidney damage.  Eye damage.  Metabolic syndrome.  Memory and concentration problems.  What changes can I make to manage my hypertension? Hypertension can be managed by making lifestyle changes and possibly by taking medicines. Your health care provider will help you make a plan to bring your blood pressure  within a normal range. Eating and drinking  Eat a diet that is high in fiber and potassium, and low in salt (sodium), added sugar, and fat. An example eating plan is called the DASH (Dietary Approaches to Stop Hypertension) diet. To eat this way: ? Eat plenty of fresh fruits and vegetables. Try to fill half of your plate at each meal with fruits and vegetables. ? Eat whole grains, such as whole wheat pasta, brown rice, or whole grain bread. Fill about one quarter of your plate with whole grains. ? Eat low-fat diary products. ? Avoid fatty cuts of meat, processed or cured meats, and poultry with skin. Fill about one quarter of your plate with lean proteins such as fish, chicken without skin, beans, eggs, and tofu. ? Avoid premade and processed foods. These tend to be higher in sodium, added sugar, and fat.  Reduce your daily sodium intake. Most people with hypertension should eat less than 1,500 mg of sodium a day.  Limit alcohol intake to no more than 1 drink a day for nonpregnant women and 2 drinks a day for men. One drink  equals 12 oz of beer, 5 oz of wine, or 1 oz of hard liquor. Lifestyle  Work with your health care provider to maintain a healthy body weight, or to lose weight. Ask what an ideal weight is for you.  Get at least 30 minutes of exercise that causes your heart to beat faster (aerobic exercise) most days of the week. Activities may include walking, swimming, or biking.  Include exercise to strengthen your muscles (resistance exercise), such as weight lifting, as part of your weekly exercise routine. Try to do these types of exercises for 30 minutes at least 3 days a week.  Do not use any products that contain nicotine or tobacco, such as cigarettes and e-cigarettes. If you need help quitting, ask your health care provider.  Control any long-term (chronic) conditions you have, such as high cholesterol or diabetes. Monitoring  Monitor your blood pressure at home as told by  your health care provider. Your personal target blood pressure may vary depending on your medical conditions, your age, and other factors.  Have your blood pressure checked regularly, as often as told by your health care provider. Working with your health care provider  Review all the medicines you take with your health care provider because there may be side effects or interactions.  Talk with your health care provider about your diet, exercise habits, and other lifestyle factors that may be contributing to hypertension.  Visit your health care provider regularly. Your health care provider can help you create and adjust your plan for managing hypertension. Will I need medicine to control my blood pressure? Your health care provider may prescribe medicine if lifestyle changes are not enough to get your blood pressure under control, and if:  Your systolic blood pressure is 130 or higher.  Your diastolic blood pressure is 80 or higher.  Take medicines only as told by your health care provider. Follow the directions carefully. Blood pressure medicines must be taken as prescribed. The medicine does not work as well when you skip doses. Skipping doses also puts you at risk for problems. Contact a health care provider if:  You think you are having a reaction to medicines you have taken.  You have repeated (recurrent) headaches.  You feel dizzy.  You have swelling in your ankles.  You have trouble with your vision. Get help right away if:  You develop a severe headache or confusion.  You have unusual weakness or numbness, or you feel faint.  You have severe pain in your chest or abdomen.  You vomit repeatedly.  You have trouble breathing. Summary  Hypertension is when the force of blood pumping through your arteries is too strong. If this condition is not controlled, it may put you at risk for serious complications.  Your personal target blood pressure may vary depending on your  medical conditions, your age, and other factors. For most people, a normal blood pressure is less than 120/80.  Hypertension is managed by lifestyle changes, medicines, or both. Lifestyle changes include weight loss, eating a healthy, low-sodium diet, exercising more, and limiting alcohol. This information is not intended to replace advice given to you by your health care provider. Make sure you discuss any questions you have with your health care provider. Document Released: 06/12/2012 Document Revised: 08/16/2016 Document Reviewed: 08/16/2016 Elsevier Interactive Patient Education  2018 [employer].  Exercising to [employer] Exercising can help you to lose weight. In order to lose weight through exercise, you need to do vigorous-intensity exercise.  You can tell that you are exercising with vigorous intensity if you are breathing very hard and fast and cannot hold a conversation while exercising. Moderate-intensity exercise helps to maintain your current weight. You can tell that you are exercising at a moderate level if you have a higher heart rate and faster breathing, but you are still able to hold a conversation. How often should I exercise? Choose an activity that you enjoy and set realistic goals. Your health care provider can help you to make an activity plan that works for you. Exercise regularly as directed by your health care provider. This may include:  Doing resistance training twice each week, such as: ? Push-ups. ? Sit-ups. ? Lifting weights. ? Using resistance bands.  Doing a given intensity of exercise for a given amount of time. Choose from these options: ? 150 minutes of moderate-intensity exercise every week. ? 75 minutes of vigorous-intensity exercise every week. ? A mix of moderate-intensity and vigorous-intensity exercise every week.  Children, pregnant women, people who are out of shape, people who are overweight, and older adults may need to consult a health care  provider for individual recommendations. If you have any sort of medical condition, be sure to consult your health care provider before starting a new exercise program. What are some activities that can help me to lose weight?  Walking at a rate of at least 4.5 miles an hour.  Jogging or running at a rate of 5 miles per hour.  Biking at a rate of at least 10 miles per hour.  Lap swimming.  Roller-skating or in-line skating.  Cross-country skiing.  Vigorous competitive sports, such as football, basketball, and soccer.  Jumping rope.  Aerobic dancing. How can I be more active in my day-to-day activities?  Use the stairs instead of the elevator.  Take a walk during your lunch break.  If you drive, park your car farther away from work or school.  If you take public transportation, get off one stop early and walk the rest of the way.  Make all of your phone calls while standing up and walking around.  Get up, stretch, and walk around every 30 minutes throughout the day. What guidelines should I follow while exercising?  Do not exercise so much that you hurt yourself, feel dizzy, or get very short of breath.  Consult your health care provider prior to starting a new exercise program.  Wear comfortable clothes and shoes with good support.  Drink plenty of water while you exercise to prevent dehydration or heat stroke. Body water is lost during exercise and must be replaced.  Work out until you breathe faster and your heart beats faster. This information is not intended to replace advice given to you by your health care provider. Make sure you discuss any questions you have with your health care provider. Document Released: 10/21/2010 Document Revised: 02/24/2016 Document Reviewed: 02/19/2014 Elsevier Interactive Patient Education  2018 [employer].  Goiter A goiter is an enlarged thyroid gland. The thyroid gland is located in the lower front of the neck. The gland produces  hormones that regulate mood, body temperature, pulse rate, and digestion. Most goiters are painless and are not a cause for serious concern. Goiters and conditions that cause goiters can be treated, if necessary. What are the causes? Causes of this condition include:  Diseases that attack healthy cells in your body (autoimmune diseases) and affect your thyroid function, such as: ? Graves disease. This causes too much thyroid hormone  to be produced and it makes your thyroid overly active (hyperthyroidism). ? Hashimoto disease. This type of inflammation of the thyroid (thyroiditis) causes too little thyroid hormone to be produced and it makes your thyroid not active enough (hypothyroidism).  Other conditions that cause thyroiditis.  Nodular goiter. This means that there are one or more small growths on your thyroid. These can create too much thyroid hormone.  Pregnancy.  Thyroid cancer. This is rare.  Certain medicines.  Radiation exposure.  Iodine deficiency.  In some cases, the cause may not be known (idiopathic). What increases the risk? This condition is more likely to develop in:  People who have a family history of goiter.  Women.  People who do not get enough iodine in their diet.  People who are older than 40.  People who smoke tobacco.  What are the signs or symptoms? Common symptoms of this condition include:  Swelling in the lower part of the neck. This swelling can range from a very small bump to a large lump.  A tight feeling in the throat.  A hoarse voice.  Other symptoms include:  Coughing.  Wheezing.  Difficulty swallowing.  Difficulty breathing.  Bulging neck veins.  Dizziness.  In some cases, there are no symptoms and thyroid hormone levels may be normal. When a goiter is the result of hyperthyroidism, symptoms may also include:  Nervousness or restlessness.  Inability to tolerate heat.  Unexplained weight loss.  Diarrhea.  Change  in the texture of hair or skin.  Changes in heart beat, such as skipped beats, extra beats, or a rapid heart rate.  Loss of menstruation.  Shaky hands.  Increased appetite.  Sleep problems.  When a goiter is the result of hypothyroidism, symptoms may also include:  Feeling like you have no energy (lethargy).  Inability to tolerate cold.  Weight gain that is not explained by a change in diet or exercise habits.  Dry skin.  Coarse hair.  Menstrual irregularity.  Constipation.  Sadness or depression.  How is this diagnosed? This condition may be diagnosed with a medical history and physical exam. You may also have other tests, including:  Blood tests to check thyroid function.  Imaging tests, such as: ? Ultrasonography. ? CT scan. ? MRI. ? Thyroid scan. You will be given a safe radioactive injection, then images will be taken of your thyroid.  Tissue sample (biopsy) of the goiter or any nodules. This checks to see if the goiter or nodules are cancerous.  How is this treated? Treatment for this condition depends on the cause. Treatment may include:  Medicines to control your thyroid.  Anti-inflammatory or steroid medicines, if inflammation is the cause.  Iodine supplements or changes in diet, if the goiter is caused by iodine deficiency.  Radiation therapy.  Surgery to remove your thyroid.  In some cases, no treatment is necessary, and your health care provider will monitor your condition at regular checkups. Follow these instructions at home:  Follow recommendations from your health care provider for any changes to your diet.  Take over-the-counter and prescription medicines only as told by your health care provider.  Do not use any tobacco products, including cigarettes, chewing tobacco, or e-cigarettes. If you need help quitting, ask your health care provider.  Keep all follow-up appointments as told by your health care provider. This is  important. Contact a health care provider if:  Your symptoms do not get better with treatment. Get help right away if:  You develop sudden,  unexplained confusion or other mental changes.  You have nausea, vomiting, or diarrhea.  You develop a fever.  Your skin or the whites of your eyes appear yellow (jaundice).  You develop chest pain.  You have trouble breathing or swallowing.  You suddenly become very weak.  You experience extreme restlessness. This information is not intended to replace advice given to you by your health care provider. Make sure you discuss any questions you have with your health care provider. Document Released: 03/08/2010 Document Revised: 04/07/2016 Document Reviewed: 09/14/2014 Elsevier Interactive Patient Education  [employer].

## 2018-07-15 ENCOUNTER — Other Ambulatory Visit: Payer: Self-pay | Admitting: Family Medicine

## 2018-07-15 DIAGNOSIS — I10 Essential (primary) hypertension: Secondary | ICD-10-CM

## 2019-04-16 ENCOUNTER — Other Ambulatory Visit: Payer: Self-pay | Admitting: Family Medicine

## 2019-04-16 DIAGNOSIS — I10 Essential (primary) hypertension: Secondary | ICD-10-CM

## 2019-04-22 ENCOUNTER — Ambulatory Visit: Payer: BLUE CROSS/BLUE SHIELD | Admitting: Family Medicine

## 2019-04-25 ENCOUNTER — Other Ambulatory Visit: Payer: Self-pay

## 2019-04-25 ENCOUNTER — Encounter: Payer: Self-pay | Admitting: Family Medicine

## 2019-04-25 ENCOUNTER — Ambulatory Visit (INDEPENDENT_AMBULATORY_CARE_PROVIDER_SITE_OTHER): Payer: Self-pay | Admitting: Family Medicine

## 2019-04-25 DIAGNOSIS — F438 Other reactions to severe stress: Secondary | ICD-10-CM

## 2019-04-25 DIAGNOSIS — I10 Essential (primary) hypertension: Secondary | ICD-10-CM

## 2019-04-25 MED ORDER — PROPRANOLOL HCL ER 80 MG PO CP24: 80.0000 mg | ORAL_CAPSULE | Freq: Every day | ORAL | 3 refills | Status: AC

## 2019-04-25 NOTE — Progress Notes (Signed)
Virtual Visit via Video Note  I connected with Diana Cherry on 04/25/19 at  9:30 AM EDT by a video enabled telemedicine application and verified that I am speaking with the correct person using two identifiers.  Location patient: home Location provider:work or home office Persons participating in the virtual visit: patient, provider  I discussed the limitations of evaluation and management by telemedicine and the availability of in person appointments. The patient expressed understanding and agreed to proceed.   HPI: Pt is a 43 yo female with pmh sig for HTN, goiter, carpal tunnel, obesity seen for f/u on bp.  Pt taking propranolol ER 60 mg but notes bp has been high.  Sysolic BP [id number].  Pt endorses HAs.  Pt eating one meal per day as working 3:30am-2:05 pm for [employer].  It is typically a frozen meal.  Pt endorses increased stress at her job. States her shift keeps changing.  Trying to drink more water.  Eating more ice.  ROS: See pertinent positives and negatives per HPI.  Past Medical History:  Diagnosis Date  . Abnormal Pap smear   . Anemia    h/o  . Anesthesia complication    PT STATES C/O ITCHING WITH ANESTHESIA  . Frequent headaches   . Hypertension   . Irregular menses     Past Surgical History:  Procedure Laterality Date  . CARPEL TUNNEL    . CESAREAN SECTION    . DILATATION & CURRETTAGE/HYSTEROSCOPY WITH RESECTOCOPE  08/27/2012   Procedure: [place];  Surgeon: Betsy Coder, MD;  Location: [facility];  Service: Gynecology;  Laterality: N/A;  D&C Hysteroscopy; polypectomy with TruClear - 1hr   . TUBAL LIGATION      Family History  Problem Relation Age of Onset  . Hypertension Mother   . Lupus Mother   . Hypertension Paternal Grandfather   . Hypertension Paternal Grandmother   . Hypertension Maternal Grandmother   . Diabetes Maternal Grandmother      Current Outpatient Medications:  .  hyoscyamine (LEVSIN) 0.125  MG/5ML ELIX, Take 0.125 mg by mouth 2 (two) times daily., Disp: , Rfl:  .  propranolol ER (INDERAL LA) 60 MG 24 hr capsule, TAKE 1 CAPSULE(60 MG) BY MOUTH DAILY, Disp: 30 capsule, Rfl: 0  EXAM:  VITALS per patient if applicable: RR between 29-79 bpm  GENERAL: alert, oriented, appears well and in no acute distress  HEENT: atraumatic, conjunctiva clear, no obvious abnormalities on inspection of external nose and ears  NECK: normal movements of the head and neck  LUNGS: on inspection no signs of respiratory distress, breathing rate appears normal, no obvious gross SOB, gasping or wheezing  CV: no obvious cyanosis  MS: moves all visible extremities without noticeable abnormality  PSYCH/NEURO: pleasant and cooperative, no obvious depression or anxiety, speech and thought processing grossly intact  ASSESSMENT AND PLAN:  Discussed the following assessment and plan:  Essential hypertension  -uncontrolled -lifestyle modifications encouraged -Pt encouraged to have her bp cuff checked to insure readings are accurate. -discussed ways to decrease stress -will increase propranolol ER from 60 to 80 mg daily - Plan: propranolol ER (INDERAL LA) 80 MG 24 hr capsule -f/u in 2-4 wks in person for bp and labs. -given precautions   I discussed the assessment and treatment plan with the patient. The patient was provided an opportunity to ask questions and all were answered. The patient agreed with the plan and demonstrated an understanding of the instructions.   The patient was  advised to call back or seek an in-person evaluation if the symptoms worsen or if the condition fails to improve as anticipated.   Billie Ruddy, MD

## 2019-06-08 IMAGING — US US ABDOMEN COMPLETE
1 series · 14 of 15 positions shown · non-contrast
Comparison: None.

CLINICAL DATA: Nausea and vomiting

EXAM:
ABDOMEN ULTRASOUND COMPLETE

[Series 1: us abdomen complete · 0.23mm/px · 14 of 15 slices shown]
[im 1/15]
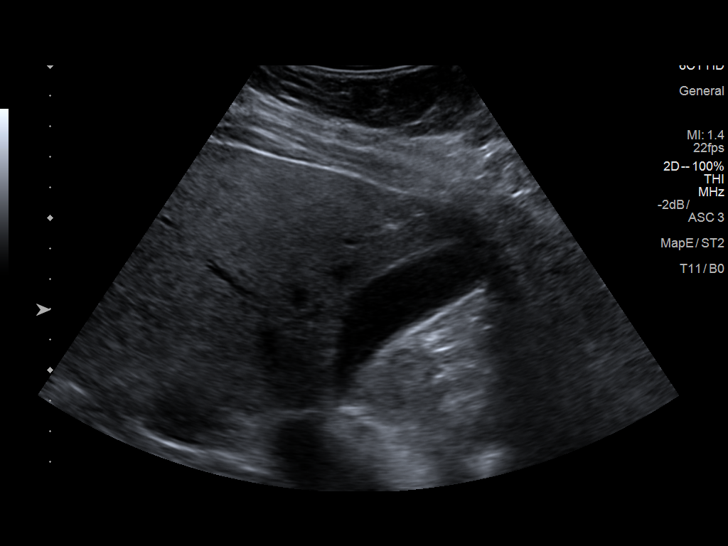
[im 2/15]
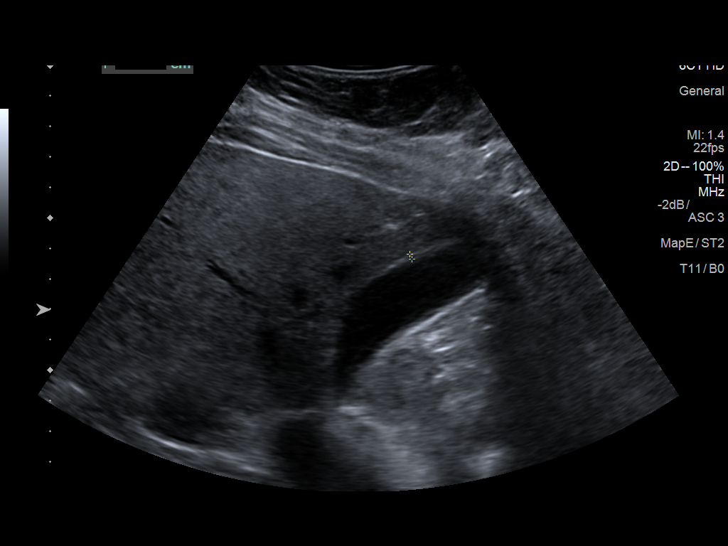
[im 3/15]
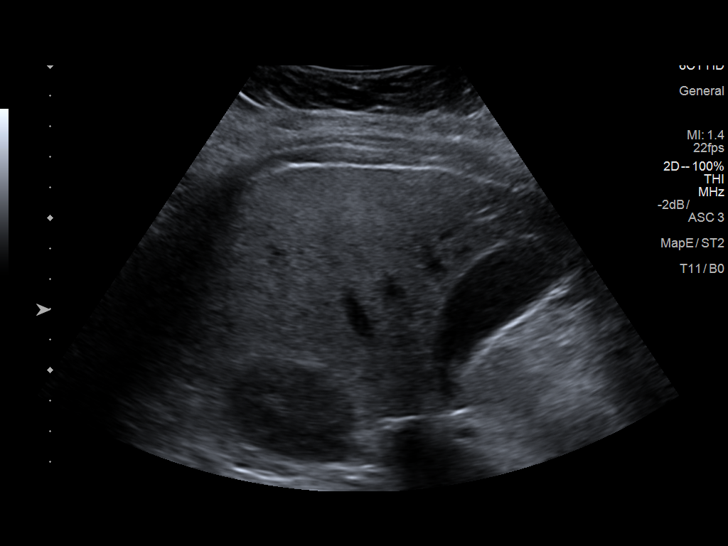
[im 4/15]
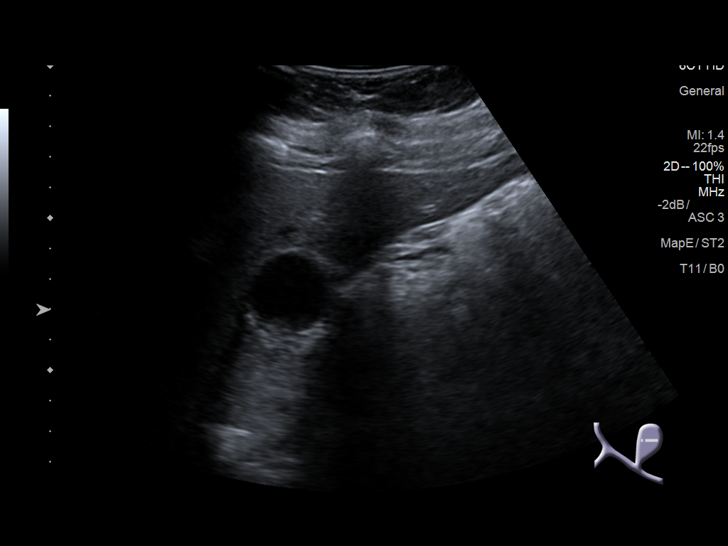
[im 5/15]
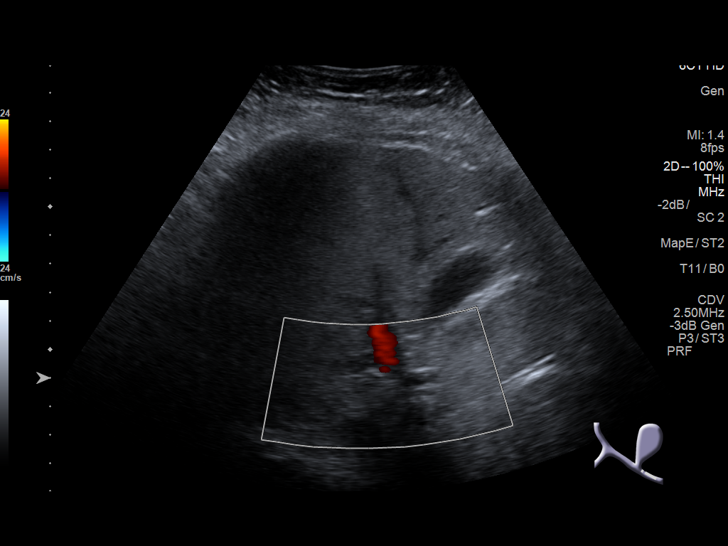
[im 6/15]
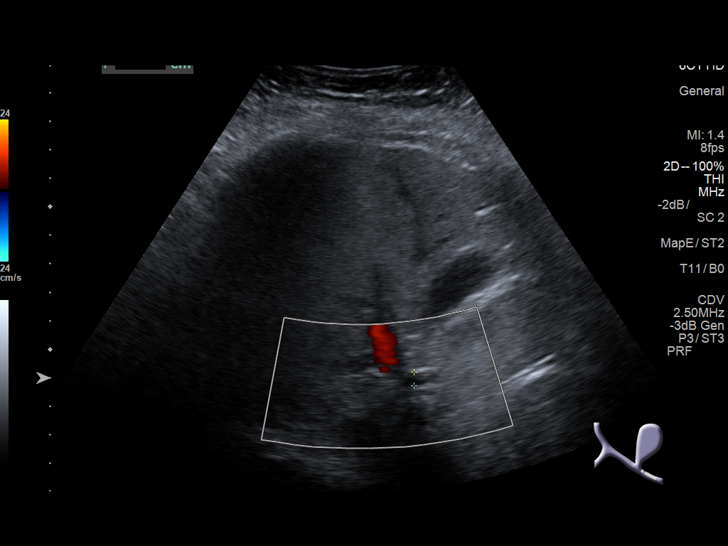
[im 7/15]
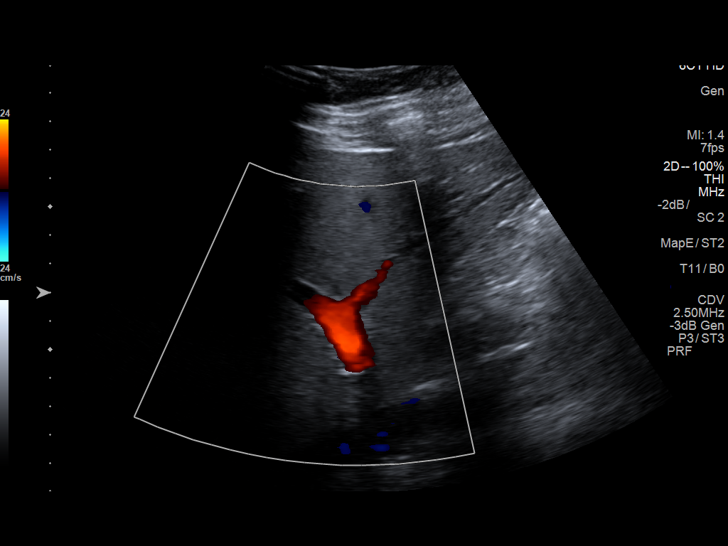
[im 9/15]
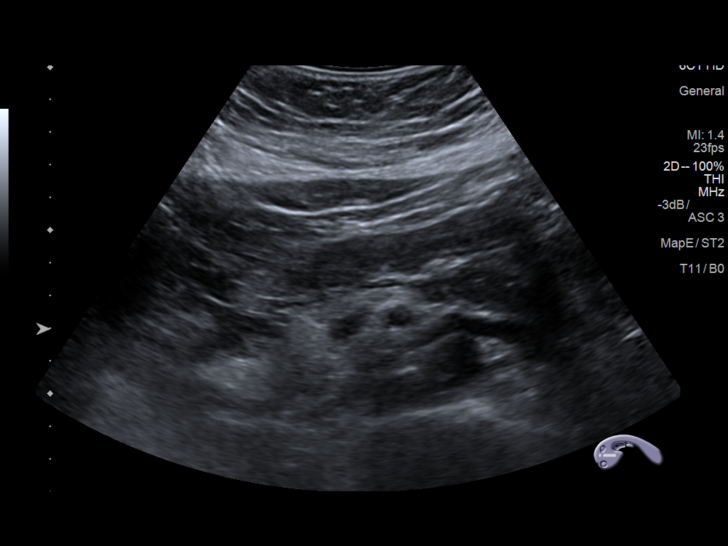
[im 10/15]
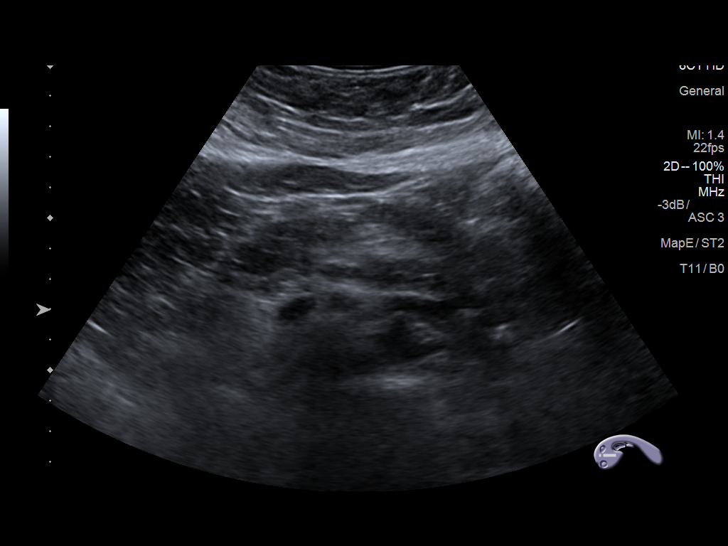
[im 11/15]
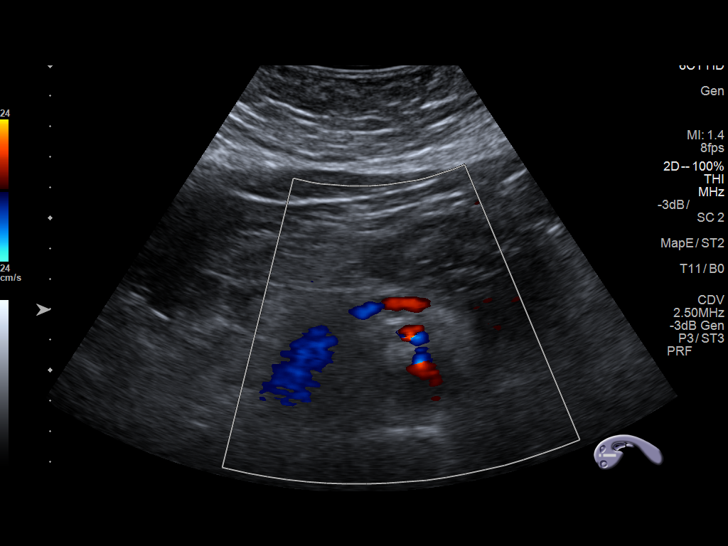
[im 12/15]
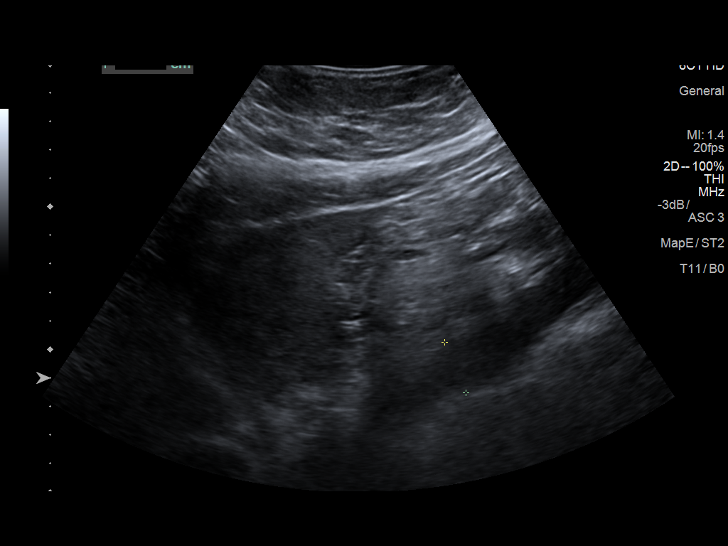
[im 13/15]
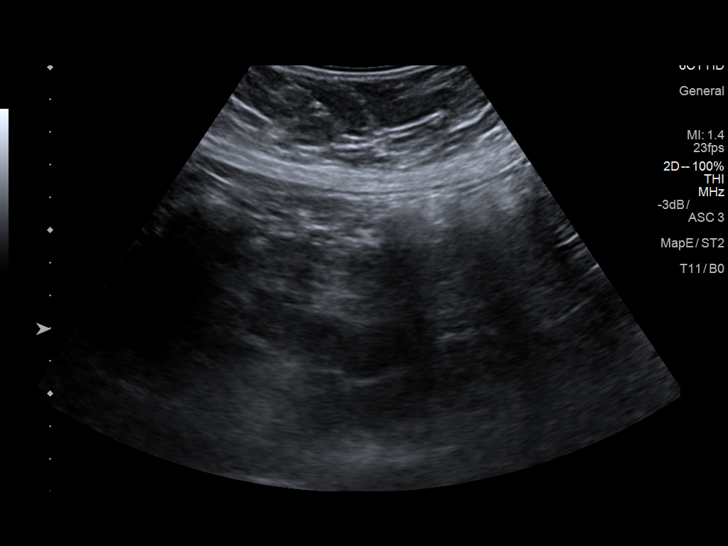
[im 14/15]
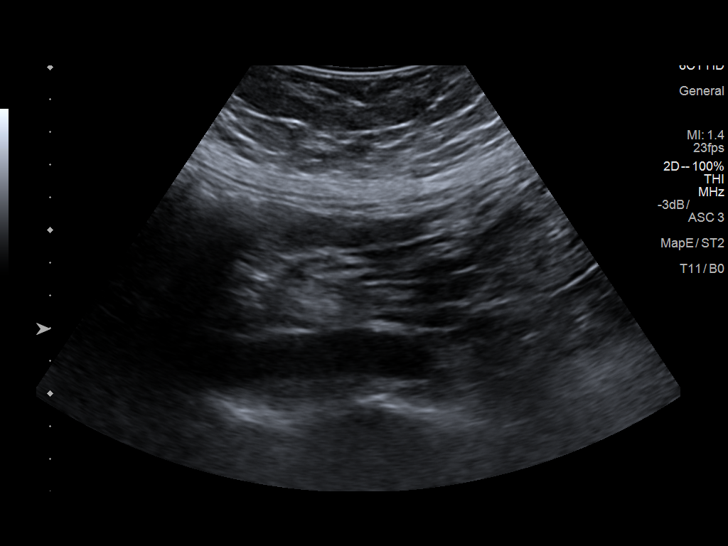
[im 15/15]
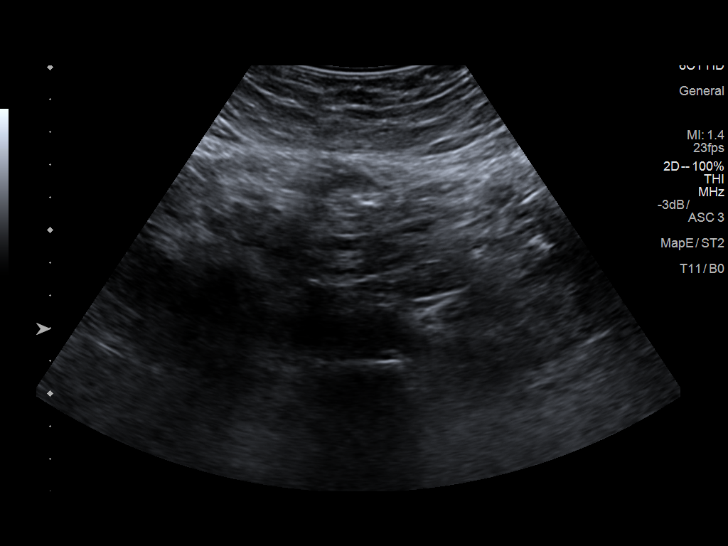

[14 of 15 positions shown; findings below may reference images not displayed]

FINDINGS: Gallbladder: No gallstones or wall thickening visualized. No
sonographic Murphy sign noted by sonographer.

Common bile duct: Diameter: 7.7 mm. No common bile duct stones are
seen.

Liver: Mildly increased in echogenicity likely related to fatty
infiltration. Portal vein is patent on color Doppler imaging with
normal direction of blood flow towards the liver.

IVC: No abnormality visualized.

Pancreas: Not well seen due to overlying bowel gas.

Spleen: Size and appearance within normal limits.

Right Kidney: Length: 11.8 cm.. Echogenicity within normal limits.
No mass or hydronephrosis visualized.

Left Kidney: Length: 10.3 cm.. Echogenicity within normal limits. No
mass or hydronephrosis visualized.

Abdominal aorta: No aneurysm visualized.

Other findings: None.
IMPRESSION: Mild fatty infiltration of the liver.

Mildly prominent common bile duct. This may be within normal limits
and correlation with laboratory values is recommended.

## 2019-08-09 IMAGING — NM NM HEPATO W/GB/PHARM/[PERSON_NAME]
2 series · 12 of 12 positions shown · non-contrast
Comparison: None.

CLINICAL DATA: Nausea and vomiting

EXAM:
NUCLEAR MEDICINE HEPATOBILIARY IMAGING WITH GALLBLADDER EF
VIEWS:
Anterior, right lateral right upper quadrant
RADIOPHARMACEUTICALS:  5.28 mCi 5c-JJm  Choletec IV

[he hepatobiliary · 3.10mm/px · 6 of 60 frames shown (1 of 2)]
[frame 6/60]
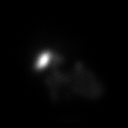
[frame 16/60]
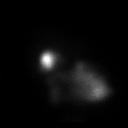
[frame 26/60]
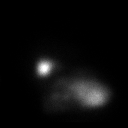
[frame 36/60]
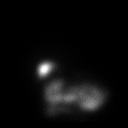
[frame 46/60]
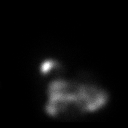
[frame 56/60]
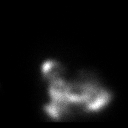

[he hepatobiliary · 3.10mm/px · 6 of 60 frames shown (2 of 2)]
[frame 6/60]
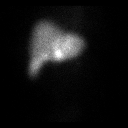
[frame 16/60]
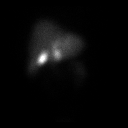
[frame 26/60]
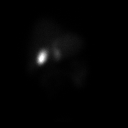
[frame 36/60]
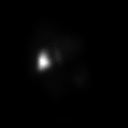
[frame 46/60]
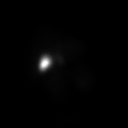
[frame 56/60]
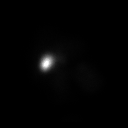

[12 of 12 positions shown; findings below may reference images not displayed]

FINDINGS: Liver uptake of radiotracer is normal. There is prompt visualization
of gallbladder and small bowel, indicating patency of the cystic and
common bile ducts. The patient consumed 8 ounces of Ensure orally
with calculation of the computer generated ejection fraction of
radiotracer from the gallbladder. The patient did not complain of
clinical symptoms with the oral Ensure consumption. The computer
generated ejection fraction of radiotracer from the gallbladder is
normal at 84%, normal greater than 33% using the oral agent.
IMPRESSION: Study within normal limits.

## 2020-10-10 ENCOUNTER — Encounter: Payer: Self-pay | Admitting: Family Medicine

## 2020-10-10 ENCOUNTER — Other Ambulatory Visit: Payer: Self-pay

## 2020-10-10 DIAGNOSIS — I10 Essential (primary) hypertension: Secondary | ICD-10-CM | POA: Insufficient documentation

## 2020-10-10 DIAGNOSIS — L723 Sebaceous cyst: Secondary | ICD-10-CM | POA: Insufficient documentation

## 2020-10-10 DIAGNOSIS — Z79899 Other long term (current) drug therapy: Secondary | ICD-10-CM | POA: Insufficient documentation

## 2020-10-11 ENCOUNTER — Emergency Department (HOSPITAL_BASED_OUTPATIENT_CLINIC_OR_DEPARTMENT_OTHER)
Admission: EM | Admit: 2020-10-11 | Discharge: 2020-10-11 | Disposition: A | Discharge status: Home / Self Care | Payer: Self-pay | Attending: Emergency Medicine | Admitting: Emergency Medicine

## 2020-10-11 ENCOUNTER — Other Ambulatory Visit: Payer: Self-pay

## 2020-10-11 ENCOUNTER — Encounter (HOSPITAL_BASED_OUTPATIENT_CLINIC_OR_DEPARTMENT_OTHER): Payer: Self-pay | Admitting: Emergency Medicine

## 2020-10-11 DIAGNOSIS — L723 Sebaceous cyst: Secondary | ICD-10-CM

## 2020-10-11 MED ORDER — PENTAFLUOROPROP-TETRAFLUOROETH EX AERO
INHALATION_SPRAY | CUTANEOUS | Status: AC
  Filled 2020-10-11: qty 30

## 2020-10-11 NOTE — ED Provider Notes (Signed)
[facility] EMERGENCY DEPARTMENT Provider Note   CSN: [id number] Arrival date & time: 10/10/20  2355     History Chief Complaint  Patient presents with  . Cyst    Diana Cherry is a 45 y.o. female.  HPI Patient with swelling behind the left ear.  States there is normally a knot there around the size of a pea but now has become more swollen.  Has been there for at least a year.  Now starting to get headaches and more pain if she lays on that side.  No fevers.  No numbness or weakness.  No confusion.  No trauma.  Has not had swelling like this before.    Past Medical History:  Diagnosis Date  . Abnormal Pap smear   . Anemia    h/o  . Anesthesia complication    PT STATES C/O ITCHING WITH ANESTHESIA  . Frequent headaches   . Hypertension   . Irregular menses     Patient Active Problem List   Diagnosis Date Noted  . Goiter 04/09/2018  . Essential hypertension 04/09/2018  . Screening for malignant neoplasm of the cervix 07/05/2012  . Obesity (BMI 30-39.9) 07/05/2012  . Carpal tunnel syndrome 07/05/2012  . Irregular bleeding 07/05/2012    Past Surgical History:  Procedure Laterality Date  . CARPEL TUNNEL    . CESAREAN SECTION    . DILATATION & CURRETTAGE/HYSTEROSCOPY WITH RESECTOCOPE  08/27/2012   Procedure: DILATATION & CURETTAGE/HYSTEROSCOPY WITH RESECTOCOPE;  Surgeon: Michael Litter, MD;  Location: [facility];  Service: Gynecology;  Laterality: N/A;  D&C Hysteroscopy; polypectomy with TruClear - 1hr   . TUBAL LIGATION       OB History    Gravida  3   Para  3   Term  3   Preterm  0   AB  0   Living  3     SAB  0   IAB  0   Ectopic  0   Multiple  0   Live Births              Family History  Problem Relation Age of Onset  . Hypertension Mother   . Lupus Mother   . Hypertension Paternal Grandfather   . Hypertension Paternal Grandmother   . Hypertension Maternal Grandmother   . Diabetes Maternal Grandmother     Social  History   Tobacco Use  . Smoking status: Never Smoker  . Smokeless tobacco: Never Used  Substance Use Topics  . Alcohol use: No  . Drug use: No    Home Medications Prior to Admission medications   Medication Sig Start Date End Date Taking? Authorizing Provider  hyoscyamine (LEVSIN) 0.125 MG/5ML ELIX Take 0.125 mg by mouth 2 (two) times daily.    [provider]  propranolol ER (INDERAL LA) 80 MG 24 hr capsule Take 1 capsule (80 mg total) by mouth daily. 04/25/19   Deeann Saint, MD    Allergies    Patient has no known allergies.  Review of Systems   Review of Systems  Constitutional: Negative for appetite change.  HENT: Negative for congestion.        Mass behind left ear  Respiratory: Negative for shortness of breath.   Cardiovascular: Negative for chest pain.  Gastrointestinal: Negative for abdominal pain.  Skin: Positive for wound.  Neurological: Positive for headaches. Negative for weakness.  Hematological: Negative for adenopathy.    Physical Exam Updated Vital Signs BP (!) 155/86  Pulse 82   Temp 98.4 F (36.9 C)   Resp 17   Ht 5\' 5"  (1.651 m)   Wt 99.3 kg   SpO2 100%   BMI 36.44 kg/m   Physical Exam Vitals and nursing note reviewed.  Constitutional:      Appearance: Normal appearance.  HENT:     Head: Atraumatic.     Comments: Behind left ear near the mastoid area there is approximately one-point centimeter fluctuant swollen mass in the skin.  Skin itself is not indurated.  Mildly mobile mass. Eyes:     General: No scleral icterus. Cardiovascular:     Rate and Rhythm: Normal rate and regular rhythm.  Pulmonary:     Effort: Pulmonary effort is normal.  Abdominal:     Tenderness: There is no abdominal tenderness.  Musculoskeletal:        General: No tenderness.  Skin:    General: Skin is warm.  Neurological:     Mental Status: She is oriented to person, place, and time.     ED Results / Procedures / Treatments   Labs (all labs  ordered are listed, but only abnormal results are displayed) Labs Reviewed - No data to display  EKG None  Radiology No results found.  Procedures . Incision and Drainage  Date/Time: 10/11/2020 7:29 AM Performed by: 12/09/2020, MD Authorized by: Benjiman Core, MD   Consent:    Consent obtained:  Verbal   Consent given by:  Patient   Risks, benefits, and alternatives were discussed: yes     Risks discussed:  Bleeding, incomplete drainage, pain and infection   Alternatives discussed:  No treatment and alternative treatment Universal protocol:    Procedure explained and questions answered to patient or proxy's satisfaction: yes     Patient identity confirmed:  Verbally with patient Location:    Type:  Abscess   Size:  1.5cm   Location:  Head   Head location:  Scalp Pre-procedure details:    Procedure prep: alcohol. Sedation:    Sedation type:  None Anesthesia:    Anesthesia method:  Topical application   Topical anesthesia: cold spray. Procedure type:    Complexity:  Simple Procedure details:    Ultrasound guidance: no     Needle aspiration: no     Incision types:  Single straight   Drainage characteristics: waxy.   Drainage amount:  Moderate   Wound treatment:  Wound left open   Packing materials:  None Post-procedure details:    Procedure completion:  Tolerated well, no immediate complications   (including critical care time)  Medications Ordered in ED Medications  pentafluoroprop-tetrafluoroeth (GEBAUERS) aerosol (has no administration in time range)    ED Course  I have reviewed the triage vital signs and the nursing notes.  Pertinent labs & imaging results that were available during my care of the patient were reviewed by me and considered in my medical decision making (see chart for details).    MDM Rules/Calculators/A&P                          Patient with likely sebaceous cyst on the left scalp.  Initially thought potentially branchial  cleft cyst but I think sebaceous more likely.  Small laceration done with 18-gauge needle.  Drained of waxy material.  Feels better.  Center of cyst not removed however.  Follow-up with ENT. Final Clinical Impression(s) / ED Diagnoses Final diagnoses:  Sebaceous cyst    Rx /  DC Orders ED Discharge Orders    None       Benjiman Core, MD 10/11/20 0730

## 2020-10-11 NOTE — ED Triage Notes (Addendum)
Reports a "knot" behind her left ear that's been there for the last year.  Reports it is sore and causing her to have a headache.  BP noted to be elevated during triage.  Reports she hasn't been taking her meds like she's supposed to.

## 2020-10-11 NOTE — Discharge Instructions (Addendum)
Follow-up with your primary care doctor or your ear nose and throat doctor for further evaluation of the cyst on your left neck.

## 2020-10-21 NOTE — Telephone Encounter (Signed)
Called pt left a message for pt to call the office and schedule an office visit with Dr Salomon Fick

## 2023-01-31 ENCOUNTER — Encounter: Payer: Self-pay | Admitting: Family Medicine

## 2023-08-07 DIAGNOSIS — R110 Nausea: Secondary | ICD-10-CM | POA: Insufficient documentation

## 2023-08-07 DIAGNOSIS — R1013 Epigastric pain: Secondary | ICD-10-CM | POA: Insufficient documentation

## 2023-08-07 DIAGNOSIS — K594 Anal spasm: Secondary | ICD-10-CM | POA: Insufficient documentation

## 2023-08-08 ENCOUNTER — Ambulatory Visit: Payer: Self-pay | Admitting: Family Medicine

## 2023-08-08 DIAGNOSIS — Z0000 Encounter for general adult medical examination without abnormal findings: Secondary | ICD-10-CM

## 2023-08-08 DIAGNOSIS — I10 Essential (primary) hypertension: Secondary | ICD-10-CM

## 2023-08-17 ENCOUNTER — Encounter: Payer: Self-pay | Admitting: Family Medicine

## 2023-10-25 ENCOUNTER — Other Ambulatory Visit (HOSPITAL_BASED_OUTPATIENT_CLINIC_OR_DEPARTMENT_OTHER): Payer: Self-pay | Admitting: Family Medicine

## 2023-10-25 DIAGNOSIS — E785 Hyperlipidemia, unspecified: Secondary | ICD-10-CM

## 2023-10-29 ENCOUNTER — Other Ambulatory Visit: Payer: Self-pay | Admitting: Family Medicine

## 2023-10-29 DIAGNOSIS — E049 Nontoxic goiter, unspecified: Secondary | ICD-10-CM

## 2023-10-30 ENCOUNTER — Inpatient Hospital Stay: Admission: RE | Admit: 2023-10-30 | Payer: Self-pay | Source: Ambulatory Visit

## 2023-11-07 ENCOUNTER — Other Ambulatory Visit (HOSPITAL_COMMUNITY)
Admission: RE | Admit: 2023-11-07 | Discharge: 2023-11-07 | Disposition: A | Discharge status: Home / Self Care | Payer: Commercial Managed Care - PPO | Source: Ambulatory Visit | Attending: Family Medicine | Admitting: Family Medicine

## 2023-11-07 ENCOUNTER — Other Ambulatory Visit (HOSPITAL_COMMUNITY): Admission: RE | Admit: 2023-11-07 | Payer: Commercial Managed Care - PPO | Source: Ambulatory Visit

## 2023-11-07 DIAGNOSIS — Z01411 Encounter for gynecological examination (general) (routine) with abnormal findings: Secondary | ICD-10-CM | POA: Diagnosis present

## 2023-11-08 ENCOUNTER — Ambulatory Visit
Admission: RE | Admit: 2023-11-08 | Discharge: 2023-11-08 | Disposition: A | Discharge status: Home / Self Care | Payer: Commercial Managed Care - PPO | Source: Ambulatory Visit | Attending: Family Medicine | Admitting: Family Medicine

## 2023-11-08 DIAGNOSIS — E049 Nontoxic goiter, unspecified: Secondary | ICD-10-CM

## 2023-11-14 LAB — CYTOLOGY - PAP
Adequacy: ABSENT
Chlamydia: NEGATIVE
Comment: NEGATIVE
Comment: NEGATIVE
Comment: NEGATIVE
Comment: NORMAL
Diagnosis: NEGATIVE
High risk HPV: NEGATIVE
Neisseria Gonorrhea: NEGATIVE
Trichomonas: NEGATIVE

## 2023-11-29 ENCOUNTER — Other Ambulatory Visit (HOSPITAL_BASED_OUTPATIENT_CLINIC_OR_DEPARTMENT_OTHER): Payer: Self-pay

## 2023-11-29 ENCOUNTER — Encounter (HOSPITAL_BASED_OUTPATIENT_CLINIC_OR_DEPARTMENT_OTHER): Payer: Self-pay

## 2024-01-04 ENCOUNTER — Other Ambulatory Visit (HOSPITAL_BASED_OUTPATIENT_CLINIC_OR_DEPARTMENT_OTHER): Payer: Commercial Managed Care - PPO

## 2024-11-06 ENCOUNTER — Other Ambulatory Visit (HOSPITAL_BASED_OUTPATIENT_CLINIC_OR_DEPARTMENT_OTHER): Payer: Self-pay | Admitting: Family Medicine

## 2024-11-06 DIAGNOSIS — E782 Mixed hyperlipidemia: Secondary | ICD-10-CM

## 2024-11-07 ENCOUNTER — Encounter (HOSPITAL_BASED_OUTPATIENT_CLINIC_OR_DEPARTMENT_OTHER): Payer: Self-pay

## 2024-11-07 ENCOUNTER — Other Ambulatory Visit: Payer: Self-pay

## 2024-11-07 ENCOUNTER — Emergency Department (HOSPITAL_BASED_OUTPATIENT_CLINIC_OR_DEPARTMENT_OTHER): Admission: EM | Admit: 2024-11-07 | Source: Home / Self Care

## 2024-11-07 LAB — CBC WITH DIFFERENTIAL/PLATELET
Abs Immature Granulocytes: 0.04 10*3/uL (ref 0.00–0.07)
Basophils Absolute: 0.1 10*3/uL (ref 0.0–0.1)
Basophils Relative: 1 %
Eosinophils Absolute: 0.1 10*3/uL (ref 0.0–0.5)
Eosinophils Relative: 2 %
HCT: 36.6 % (ref 36.0–46.0)
Hemoglobin: 12.2 g/dL (ref 12.0–15.0)
Immature Granulocytes: 1 %
Lymphocytes Relative: 45 %
Lymphs Abs: 2.1 10*3/uL (ref 0.7–4.0)
MCH: 29.3 pg (ref 26.0–34.0)
MCHC: 33.3 g/dL (ref 30.0–36.0)
MCV: 87.8 fL (ref 80.0–100.0)
Monocytes Absolute: 0.3 10*3/uL (ref 0.1–1.0)
Monocytes Relative: 6 %
Neutro Abs: 2.1 10*3/uL (ref 1.7–7.7)
Neutrophils Relative %: 45 %
Platelets: 236 10*3/uL (ref 150–400)
RBC: 4.17 MIL/uL (ref 3.87–5.11)
RDW: 14 % (ref 11.5–15.5)
Smear Review: NORMAL
WBC: 4.6 10*3/uL (ref 4.0–10.5)
nRBC: 0 % (ref 0.0–0.2)

## 2024-11-07 LAB — BASIC METABOLIC PANEL WITH GFR
Anion gap: 20 — ABNORMAL HIGH (ref 5–15)
BUN: 21 mg/dL — ABNORMAL HIGH (ref 6–20)
CO2: 20 mmol/L — ABNORMAL LOW (ref 22–32)
Calcium: 10.1 mg/dL (ref 8.9–10.3)
Chloride: 104 mmol/L (ref 98–111)
Creatinine, Ser: 0.7 mg/dL (ref 0.44–1.00)
GFR, Estimated: >60 mL/min
Glucose, Bld: 109 mg/dL — ABNORMAL HIGH (ref 70–99)
Potassium: 3.6 mmol/L (ref 3.5–5.1)
Sodium: 143 mmol/L (ref 135–145)

## 2024-11-07 NOTE — ED Triage Notes (Signed)
 Pt reports taking estrogen and reports feeling sick on stomach.Pt also reports getting flu shot, PNA shot, and shingles shot at the same time.

## 2024-11-07 NOTE — ED Triage Notes (Signed)
 Pt picked up from work at the [employer] by EMS. Pt reports drinking 2 glasses of wine earlier in the day. Pt reported feeling nauseous, and out of it, reports not eating and being under a lot of stress. Pt unable to recall what medications she on for EMS. Pt reports being on a new medication for menopause symptoms. VSS for EMS, NAD reported.
